# Patient Record
Sex: Male | Born: 1975 | Race: White | Hispanic: No | Marital: Married | Smoking: Former smoker
Health system: Southern US, Community
[De-identification: ages and names within clinical notes are randomized; demographics above are authoritative.]

## PROBLEM LIST (undated history)

## (undated) DIAGNOSIS — R7989 Other specified abnormal findings of blood chemistry: Secondary | ICD-10-CM

## (undated) DIAGNOSIS — G47419 Narcolepsy without cataplexy: Secondary | ICD-10-CM

## (undated) DIAGNOSIS — F419 Anxiety disorder, unspecified: Secondary | ICD-10-CM

## (undated) DIAGNOSIS — E785 Hyperlipidemia, unspecified: Secondary | ICD-10-CM

## (undated) HISTORY — PX: KNEE ARTHROSCOPY: SUR90

## (undated) HISTORY — PX: WRIST SURGERY: SHX841

## (undated) HISTORY — PX: APPENDECTOMY: SHX54

## (undated) HISTORY — PX: CERVICAL DISCECTOMY: SHX98

---

## 2002-06-26 ENCOUNTER — Encounter: Payer: Self-pay | Admitting: Sports Medicine

## 2002-06-26 ENCOUNTER — Ambulatory Visit (HOSPITAL_COMMUNITY): Admission: RE | Admit: 2002-06-26 | Discharge: 2002-06-26 | Payer: Self-pay | Admitting: Sports Medicine

## 2006-07-20 ENCOUNTER — Ambulatory Visit (HOSPITAL_COMMUNITY): Admission: RE | Admit: 2006-07-20 | Discharge: 2006-07-21 | Payer: Self-pay | Admitting: Neurological Surgery

## 2009-04-27 ENCOUNTER — Ambulatory Visit (HOSPITAL_COMMUNITY): Admission: RE | Admit: 2009-04-27 | Discharge: 2009-04-27 | Payer: Self-pay | Admitting: Neurological Surgery

## 2010-11-16 ENCOUNTER — Encounter (HOSPITAL_COMMUNITY)
Admission: RE | Admit: 2010-11-16 | Discharge: 2010-11-16 | Disposition: A | Discharge status: Home / Self Care | Payer: 59 | Source: Ambulatory Visit | Attending: Orthopedic Surgery | Admitting: Orthopedic Surgery

## 2010-11-16 LAB — SURGICAL PCR SCREEN
MRSA, PCR: NEGATIVE
Staphylococcus aureus: NEGATIVE

## 2010-11-16 LAB — COMPREHENSIVE METABOLIC PANEL
Albumin: 4.3 g/dL (ref 3.5–5.2)
BUN: 17 mg/dL (ref 6–23)
CO2: 29 mEq/L (ref 19–32)
Chloride: 103 mEq/L (ref 96–112)
Creatinine, Ser: 0.74 mg/dL (ref 0.4–1.5)
GFR calc non Af Amer: >60 mL/min (ref >60)
Glucose, Bld: 104 mg/dL — ABNORMAL HIGH (ref 70–99)
Total Bilirubin: 1.8 mg/dL — ABNORMAL HIGH (ref 0.3–1.2)

## 2010-11-16 LAB — CBC
HCT: 45.1 % (ref 39.0–52.0)
MCH: 31.7 pg (ref 26.0–34.0)
MCV: 92.2 fL (ref 78.0–100.0)
Platelets: 154 10*3/uL (ref 150–400)
RBC: 4.89 MIL/uL (ref 4.22–5.81)

## 2010-11-16 LAB — DIFFERENTIAL
Eosinophils Absolute: 0.1 10*3/uL (ref 0.0–0.7)
Lymphs Abs: 2.5 10*3/uL (ref 0.7–4.0)
Monocytes Relative: 9 % (ref 3–12)
Neutrophils Relative %: 48 % (ref 43–77)

## 2010-11-18 ENCOUNTER — Observation Stay (HOSPITAL_COMMUNITY)
Admission: RE | Admit: 2010-11-18 | Discharge: 2010-11-19 | Disposition: A | Discharge status: Home / Self Care | Payer: 59 | Source: Ambulatory Visit | Attending: Orthopedic Surgery | Admitting: Orthopedic Surgery

## 2010-11-18 ENCOUNTER — Other Ambulatory Visit: Payer: Self-pay | Admitting: Orthopedic Surgery

## 2010-11-18 DIAGNOSIS — F411 Generalized anxiety disorder: Secondary | ICD-10-CM | POA: Insufficient documentation

## 2010-11-18 DIAGNOSIS — D1600 Benign neoplasm of scapula and long bones of unspecified upper limb: Principal | ICD-10-CM | POA: Insufficient documentation

## 2010-11-18 DIAGNOSIS — Z01812 Encounter for preprocedural laboratory examination: Secondary | ICD-10-CM | POA: Insufficient documentation

## 2010-11-18 LAB — GRAM STAIN
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
## 2010-11-23 LAB — TISSUE CULTURE

## 2010-11-23 LAB — ANAEROBIC CULTURE

## 2010-12-20 LAB — FUNGUS CULTURE W SMEAR: Fungal Smear: NONE SEEN

## 2010-12-24 NOTE — Op Note (Signed)
NAMEMIACHEL, Ray Ashley               ACCOUNT NO.:  [id number]   MEDICAL RECORD NO.:  [id number]          PATIENT TYPE:  AMB   LOCATION:  SDS                          FACILITY:  [facility]   PHYSICIAN:  Stefani Dama, M.D.  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   POSTOPERATIVE DIAGNOSIS:  Herniated nucleus pulposus L5-S1 left with  left lumbar radiculopathy.   PROCEDURE:  METRx diskectomy L5-S1 left with operating microscope  microdissection technique.   SURGEON:  Dr. Danielle Dess.   FIRST ASSISTANT:  Dr. Delma Officer.   ANESTHESIA:  General endotracheal.   INDICATIONS:  Mr. Ray Ashley is a 35 year old individual who has had  significant back and left lower extremity pain.  He has a large extruded  fragment of disk at the L5-S1 level.  After careful consideration of his  options he has been advised regarding surgical decompression of his disk  herniation.   PROCEDURE:  The patient was brought to the operating room supine on the  stretcher.  After smooth induction of general endotracheal anesthesia he  was turned prone and the back was prepped with DuraPrep and draped in a  sterile fashion.  Using fluoroscopic guidance to localize the L5-S1  space on the left the skin above this area was infiltrated with 30 mL of  local 1% lidocaine with 1:1000 epinephrine mixed 50 50 with 0.5%  Marcaine.  An incision was made in the left paramedian by approximately  8 mm and a K-wire was then passed down the laminar arch of L5.  A series  of dilators and was then passed over the K-wire to use a wanding  technique to dissect the area at the L5-S1 interspace.  Ultimately an 18  mm diameter x 5 cm deep endoscopic cannula was affixed to the operating  table with a clamp.  The operating microscope was draped and brought  in  through the field to look through this aperture.  At the base was some  soft tissue which was cauterized with the monopolar cautery and then the  laminar arch of L5 was identified.  Soft tissue was cleared from over it  and on the inferior aspect a laminotomy was then created removing the  inferior marginal lamina of L5 out to the medial wall of the facet.  The  ligament was then taken up with a 2 and a 3 mm Kerrison punch until the  dura was identified.  The dura itself was noted be very reddened in  color with some dilated venous syncytium forming over it.  This was  dissected away and the underlying dura was then retracted medially.  Underneath this area there was several fragments of discs that were  immediately encountered.  They were dissected and removed and allowed  access to the disk space.  There was a substantial rent in the posterior  longitudinal ligament.  Palpating cephalad I identified several other  fragments of disc under  the common dural tube above the takeoff of the  S1 nerve root.  Laterally several pieces of small disc like material  were removed, but then the path for the L5 nerve root above could be  easily sounded out.  No other fragments of disc were returned.   The disk space being this thus wide-open it was felt that it would need  to be cleaned and a 2 and 3 mm Kerrison punch was used to lift up the  ligament, remove it and then the disk space was entered with a series of  Kerrison rongeurs to evacuate a modest amount of disc material from  within it.  After the disk was evacuated and there was felt not to be  any other free fragments of disc within the space that would herniate  out irrigation on several courses with a small pressure irrigator  yielded no other fragments.  The space was explored below the nerve  here.  On the lateral and dorsal aspect of the common dural tube there  was noted be a very large fragment of disc material.  This was quite   adherent to the dura and after some gentle mobilization a large singular  fragment of disc was removed from the space.  This allowed good  visualization and decompression of the S1 nerve root.  Further  exploration yielded some other small fragments under the common dural  tube, but in the end this space was well decompressed.  The S1 nerve  root was well decompressed.  Hemostasis in the soft tissues was obtained  with the bipolar cautery and small pledgets of Gelfoam which were  irrigated away. One mL of fentanyl was left in the epidural space.  This  was 50 mcg.  The microscope was then brought to low-power.  The  subcutaneous fascia was closed with 3-0 Vicryl and 3-0 Vicryl used close  the skin.  Dermabond was placed on the skin.  Blood loss was estimated  at less than 50 mL.  The patient tolerated the procedure well and was  returned to the recovery in stable condition.      Stefani Dama, M.D.  Electronically Signed     HJE/MEDQ  D:  07/20/2006  T:  07/21/2006  Job:  [id number]

## 2011-01-03 LAB — AFB CULTURE WITH SMEAR (NOT AT ARMC): Acid Fast Smear: NONE SEEN

## 2011-04-12 ENCOUNTER — Other Ambulatory Visit: Payer: Self-pay | Admitting: Internal Medicine

## 2011-04-12 DIAGNOSIS — R7401 Elevation of levels of liver transaminase levels: Secondary | ICD-10-CM

## 2011-04-18 ENCOUNTER — Other Ambulatory Visit: Payer: Self-pay | Admitting: Internal Medicine

## 2011-04-18 ENCOUNTER — Ambulatory Visit
Admission: RE | Admit: 2011-04-18 | Discharge: 2011-04-18 | Disposition: A | Discharge status: Home / Self Care | Payer: 59 | Source: Ambulatory Visit | Attending: Internal Medicine | Admitting: Internal Medicine

## 2011-04-18 DIAGNOSIS — R7401 Elevation of levels of liver transaminase levels: Secondary | ICD-10-CM

## 2013-02-21 ENCOUNTER — Emergency Department (HOSPITAL_COMMUNITY): Payer: 59

## 2013-02-21 ENCOUNTER — Emergency Department (HOSPITAL_COMMUNITY)
Admission: EM | Admit: 2013-02-21 | Discharge: 2013-02-21 | Disposition: A | Discharge status: Home / Self Care | Payer: 59 | Attending: Emergency Medicine | Admitting: Emergency Medicine

## 2013-02-21 ENCOUNTER — Encounter (HOSPITAL_COMMUNITY): Payer: Self-pay | Admitting: Emergency Medicine

## 2013-02-21 DIAGNOSIS — F411 Generalized anxiety disorder: Secondary | ICD-10-CM | POA: Insufficient documentation

## 2013-02-21 DIAGNOSIS — R5383 Other fatigue: Secondary | ICD-10-CM | POA: Insufficient documentation

## 2013-02-21 DIAGNOSIS — Z87891 Personal history of nicotine dependence: Secondary | ICD-10-CM | POA: Insufficient documentation

## 2013-02-21 DIAGNOSIS — J3489 Other specified disorders of nose and nasal sinuses: Secondary | ICD-10-CM | POA: Insufficient documentation

## 2013-02-21 DIAGNOSIS — I4891 Unspecified atrial fibrillation: Secondary | ICD-10-CM

## 2013-02-21 DIAGNOSIS — G4733 Obstructive sleep apnea (adult) (pediatric): Secondary | ICD-10-CM | POA: Insufficient documentation

## 2013-02-21 DIAGNOSIS — I499 Cardiac arrhythmia, unspecified: Secondary | ICD-10-CM | POA: Insufficient documentation

## 2013-02-21 DIAGNOSIS — E291 Testicular hypofunction: Secondary | ICD-10-CM | POA: Insufficient documentation

## 2013-02-21 DIAGNOSIS — R51 Headache: Secondary | ICD-10-CM | POA: Insufficient documentation

## 2013-02-21 DIAGNOSIS — Z91199 Patient's noncompliance with other medical treatment and regimen due to unspecified reason: Secondary | ICD-10-CM | POA: Insufficient documentation

## 2013-02-21 DIAGNOSIS — Z79899 Other long term (current) drug therapy: Secondary | ICD-10-CM | POA: Insufficient documentation

## 2013-02-21 DIAGNOSIS — R5381 Other malaise: Secondary | ICD-10-CM | POA: Insufficient documentation

## 2013-02-21 DIAGNOSIS — Z9119 Patient's noncompliance with other medical treatment and regimen: Secondary | ICD-10-CM | POA: Insufficient documentation

## 2013-02-21 DIAGNOSIS — R000 Tachycardia, unspecified: Secondary | ICD-10-CM | POA: Insufficient documentation

## 2013-02-21 DIAGNOSIS — E785 Hyperlipidemia, unspecified: Secondary | ICD-10-CM | POA: Insufficient documentation

## 2013-02-21 DIAGNOSIS — R61 Generalized hyperhidrosis: Secondary | ICD-10-CM | POA: Insufficient documentation

## 2013-02-21 DIAGNOSIS — G47419 Narcolepsy without cataplexy: Secondary | ICD-10-CM | POA: Insufficient documentation

## 2013-02-21 HISTORY — DX: Narcolepsy without cataplexy: G47.419

## 2013-02-21 HISTORY — DX: Hyperlipidemia, unspecified: E78.5

## 2013-02-21 HISTORY — DX: Other specified abnormal findings of blood chemistry: R79.89

## 2013-02-21 HISTORY — DX: Anxiety disorder, unspecified: F41.9

## 2013-02-21 HISTORY — DX: Gilbert syndrome: E80.4

## 2013-02-21 LAB — URINALYSIS, ROUTINE W REFLEX MICROSCOPIC
Bilirubin Urine: NEGATIVE
Leukocytes, UA: NEGATIVE
Nitrite: NEGATIVE
Specific Gravity, Urine: 1.016 (ref 1.005–1.030)
Urobilinogen, UA: 0.2 mg/dL (ref 0.0–1.0)
pH: 5.5 (ref 5.0–8.0)

## 2013-02-21 LAB — COMPREHENSIVE METABOLIC PANEL
ALT: 33 U/L (ref 0–53)
CO2: 23 mEq/L (ref 19–32)
Calcium: 9.2 mg/dL (ref 8.4–10.5)
Creatinine, Ser: 0.71 mg/dL (ref 0.50–1.35)
GFR calc Af Amer: >90 mL/min (ref >90)
GFR calc non Af Amer: >90 mL/min (ref >90)
Glucose, Bld: 119 mg/dL — ABNORMAL HIGH (ref 70–99)
Sodium: 136 mEq/L (ref 135–145)
Total Protein: 7.2 g/dL (ref 6.0–8.3)

## 2013-02-21 LAB — CBC WITH DIFFERENTIAL/PLATELET
Eosinophils Absolute: 0.2 10*3/uL (ref 0.0–0.7)
Eosinophils Relative: 2 % (ref 0–5)
HCT: 48.7 % (ref 39.0–52.0)
Lymphs Abs: 3.2 10*3/uL (ref 0.7–4.0)
MCH: 32.6 pg (ref 26.0–34.0)
MCV: 91.9 fL (ref 78.0–100.0)
Monocytes Absolute: 0.6 10*3/uL (ref 0.1–1.0)
Platelets: 160 10*3/uL (ref 150–400)
RDW: 12.4 % (ref 11.5–15.5)

## 2013-02-21 LAB — POCT I-STAT TROPONIN I: Troponin i, poc: 0.01 ng/mL (ref 0.00–0.08)

## 2013-02-21 LAB — APTT: aPTT: 29 seconds (ref 24–37)

## 2013-02-21 LAB — PROTIME-INR: INR: 0.9 (ref 0.00–1.49)

## 2013-02-21 MED ORDER — DILTIAZEM HCL 25 MG/5ML IV SOLN
25.0000 mg | Freq: Once | INTRAVENOUS | Status: AC
  Administered 2013-02-21: 25 mg via INTRAVENOUS
  Filled 2013-02-21: qty 5

## 2013-02-21 MED ORDER — FLECAINIDE ACETATE 100 MG PO TABS
300.0000 mg | ORAL_TABLET | Freq: Once | ORAL | Status: AC
  Administered 2013-02-21: 300 mg via ORAL
  Filled 2013-02-21: qty 3

## 2013-02-21 MED ORDER — SODIUM CHLORIDE 0.9 % IV SOLN
Freq: Once | INTRAVENOUS | Status: AC
  Administered 2013-02-21: 09:00:00 via INTRAVENOUS

## 2013-02-21 MED ORDER — DILTIAZEM HCL 100 MG IV SOLR
10.0000 mg/h | INTRAVENOUS | Status: DC
  Administered 2013-02-21: 10 mg/h via INTRAVENOUS

## 2013-02-21 NOTE — ED Notes (Signed)
Cardiology MD at bedside.

## 2013-02-21 NOTE — Progress Notes (Signed)
Middle aged man with new onset of rapid atrial fibrillation.  Rx with IV Cardizem for rate control --? Rate in 70s but still AF.  Seen by [facility], who gave him po flecanide, which converted him.  Released to followup with [facility] in the office.

## 2013-02-21 NOTE — Progress Notes (Addendum)
Pt spontaneously converted to sinus rhythm after flecainide given. Spoke with Dr Eden Emms. OK to d/c home today. Outpatient follow up arranged.

## 2013-02-21 NOTE — ED Provider Notes (Signed)
History    CSN: [id number] Arrival date & time 02/21/13  4098  First MD Initiated Contact with Patient 02/21/13 0845     Chief Complaint  Patient presents with  . Palpitations    HPI  Pt is a 37 yo M with pmh of Gilbert's syndrome, herniated disk s/p diskectomy, central sleep apnea on cpap at home, anxiety, and narcolepsy, on testosterone replacement who presents with palpitations less than 1 day ago. Pt reports last night at approximately 10 pm he had a flutter in chest that persisted throughout the night and in the morning. He has not had any prior episodes of palpitations. No fever, chills,dyspnea, CP, cough, pain in jaw or left arm, leg swelling, tremor, weight loss, or diaphoresis.  Pt reports he has not been able to take his narcolepsy medication, modavigil for past 2 weeks due to insurance issues and has been feeling fatigued as a result. He has been camping in the past weeks and this past weekend was not able to use his CPAP machine as he normally does.  Has been on testosterone replacement for low testosterone levels. No pmh of arrhythmias, hypertension, or pulmonary problems. No pmh of thyroid problems.        Past Medical History  Diagnosis Date  . Narcolepsy   . Anxiety    History reviewed. No pertinent past surgical history. History reviewed. No pertinent family history. History  Substance Use Topics  . Smoking status: Never Smoker   . Smokeless tobacco: Not on file  . Alcohol Use: Yes     Comment: occ    Review of Systems  Constitutional: Positive for diaphoresis (at baseline) and fatigue. Negative for fever, chills and unexpected weight change.  HENT: Positive for sneezing. Negative for trouble swallowing and voice change.   Respiratory: Negative for cough and shortness of breath.   Cardiovascular: Positive for palpitations. Negative for chest pain and leg swelling.  Gastrointestinal: Negative for nausea, vomiting, abdominal pain, diarrhea, constipation and blood  in stool.  Endocrine: Negative for heat intolerance.  Genitourinary: Negative for hematuria and difficulty urinating.  Neurological: Positive for headaches. Negative for dizziness and tremors.  Psychiatric/Behavioral: Negative for agitation. The patient is not nervous/anxious.     Allergies  Review of patient's allergies indicates no known allergies.  Home Medications   Current Outpatient Rx  Name  Route  Sig  Dispense  Refill  . B Complex Vitamins (VITAMIN B COMPLEX) TABS   Oral   Take 1 tablet by mouth daily.         . fish oil-omega-3 fatty acids 1000 MG capsule   Oral   Take 1 g by mouth daily.         . meloxicam (MOBIC) 7.5 MG tablet   Oral   Take 7.5 mg by mouth daily.         . methocarbamol (ROBAXIN) 500 MG tablet   Oral   Take 500 mg by mouth 4 (four) times daily as needed.         . modafinil (PROVIGIL) 200 MG tablet   Oral   Take 200 mg by mouth daily.         Marland Kitchen testosterone cypionate (DEPOTESTOTERONE CYPIONATE) 200 MG/ML injection   Intramuscular   Inject 300 mg into the muscle every 14 (fourteen) days.         Marland Kitchen venlafaxine XR (EFFEXOR-XR) 150 MG 24 hr capsule   Oral   Take 150 mg by mouth daily.  BP 105/48  Pulse 75  Temp(Src) 97.8 F (36.6 C) (Oral)  Resp 13  SpO2 98% Physical Exam  Constitutional: He is oriented to person, place, and time. He appears well-developed and well-nourished. No distress.  HENT:  Head: Normocephalic and atraumatic.  Eyes: EOM are normal.  Neck: Normal range of motion. Neck supple.  Cardiovascular: Intact distal pulses.   No murmur heard. tachycardic Irregular rhythm   Pulmonary/Chest: Effort normal and breath sounds normal. No respiratory distress.  Abdominal: Soft. Bowel sounds are normal. He exhibits no distension.  Musculoskeletal: Normal range of motion. He exhibits no edema.  Neurological: He is alert and oriented to person, place, and time.  Skin: Skin is warm and dry. He is not  diaphoretic.  Psychiatric: He has a normal mood and affect.    ED Course  Procedures (including critical care time) Labs Reviewed  CBC WITH DIFFERENTIAL - Abnormal; Notable for the following:    Hemoglobin 17.3 (*)    All other components within normal limits  COMPREHENSIVE METABOLIC PANEL - Abnormal; Notable for the following:    Glucose, Bld 119 (*)    Alkaline Phosphatase 38 (*)    All other components within normal limits  URINALYSIS, ROUTINE W REFLEX MICROSCOPIC  TSH  PROTIME-INR  APTT  T4, FREE  POCT I-STAT TROPONIN I   Dg Chest Port 1 View  02/21/2013   *RADIOLOGY REPORT*  Clinical Data: New onset atrial fibrillation  PORTABLE CHEST - 1 VIEW  Comparison: 07/20/2016  Findings: The heart size and mediastinal contours are within normal limits.  Both lungs are clear.  The visualized skeletal structures are unremarkable.  IMPRESSION: Negative exam.   Original Report Authenticated By: Signa Kell, M.D.   1. Atrial fibrillation with RVR     MDM  Assessment: 37 yo M with pmh of herniated disk s/p diskectomy, central OSA, narcolepsy, and anxiety who presents with palpitations less than 1 day ago.     Plan:   Palpitations - Arrythmia vs ACS vs hyperthyroidism vs Central sleep apena vs CHF vs SE medication -Obtain 12-lead EKG ----> Atrial fibrillation with ventricular response and incomplete right bundle branch block, no prior EKG for comparision     -Obtain troponin levels ---> WNL -Obtain CXR ---> No evidence of cardiopulmonary disease  -Obtain CBC w/ diff ---> elevated hemoglobin (17.3) -Obtain CMP ---> hyperglycemia (119) -Obtain UA --->  Normal  -Obtain PT/PTT ---> WNL -Obtain TSH, free T4 ---> WNL  -Administer bolus and continuous diltiazem for rate control  ---> returned to normal rate -Administer NS IV fluids  -Monitor on cardiac monitor  -Consult to cardiology ---> given PO 300mg  flecainide with spontaneous coversion back to normal sinus rhythm         Repeat EKG  after Flecainide:   Date: 02/21/2013  Rate: 70  Rhythm: normal sinus rhythm  QRS Axis: normal  Intervals: normal  ST/T Wave abnormalities: normal  Conduction Disutrbances:none  Narrative Interpretation:   Old EKG Reviewed: changes noted    Disposition: Home--> Pt with new-onset atrial fibrillation with rapid ventricular response with return to normal rate with diltiazem and normal sinus rhythm after flecainide. Etiology of new-onset atrial fibrillation unknown. No evidence of cardiopulmonary disease on CXR and normal thyroid function. Possibly due to central sleep apnea (has not been using CPAP on regular basis) or medication induced (last 2 weeks not on modafinil for narcolepsy) . Will need to follow-up with cardiology for further work-up of cardiac function.  Discharge Instructions:  -Continue taking home medications  -To follow-up with Dr. Eden Emms on 7/21                 Otis Brace, MD 02/21/13 1614

## 2013-02-21 NOTE — Consult Note (Signed)
CARDIOLOGY CONSULT NOTE   Patient ID: Ray Ashley MRN: [id number] DOB/AGE: 16-Jul-1976 36 y.o.  Admit date: 02/21/2013  Primary Physician   Gwen Pounds, MD Primary Cardiologist   New/Shilynn Hoch Reason for Consultation  New onset Ray-fib  OZH:YQMVH Ray Ashley is Ray 37 y.o. male with no cardiac history and positive h/o Gilbert's disease and hyperlipidemia who presents c/o of palpitations since yesterday evening.   Last night at 10:00pm as he was drinking Ray slushy he began feeling "fluttering in his chest". Thought it was indigestion or due to him drinking cold slushy so he went to bed. Woke up at 11:30pm and was unable to fall asleep again until 1:00am. This is unusual for him as he normally sleeps through the night. He is unsure if palpitations are what woke him up, but the "fluttering in his chest" was present during this time. Upon waking this morning, palpitations were still present. Due to concern that the palpitations were not going away, he and his wife presented to the ED. Denies chest pain or tightness, SOB, dizziness, HA, fever, peripheral edema, dry skin, abdominal pain, or diarrhea. Admits that he has not taken Provigil for past 2 weeks dt insurance change and difficulty obtaining this medication. During this time, caffeine has increased from 3 to 5 servings per day of mostly coffee. Otherwise no change in medications. Child recently had scarlet fever and now has second episode of strep throat. Patient denies recent illness and has not experienced sore throat or dysphagia. Wife notes BP normally 140s systolic and is concerned as BP in ED was 105/48.   Cardizem 25mg  IV bolus administered and Cardizem drip 15mL/hr initiated at 9:00am. Cardizem drip titrated up to 49mL/hr at 11:30am.    Past Medical History  Diagnosis Date  . Narcolepsy   . Anxiety   . Gilbert's disease     2013  . Hyperlipidemia   . Low testosterone      Past Surgical History  Procedure Laterality Date  .  Cervical discectomy      2009  . Wrist surgery      2013  . Knee arthroscopy      1999 and 2004  . Appendectomy      childhood    No Known Allergies  I have reviewed the patient's current medications . flecainide  300 mg Oral Once   . diltiazem (CARDIZEM) infusion 15 mg/hr (02/21/13 1127)     Prior to Admission medications   Medication Sig Start Date End Date Taking? Authorizing Provider  B Complex Vitamins (VITAMIN B COMPLEX) TABS Take 1 tablet by mouth daily.   Yes Historical Provider, MD  fish oil-omega-3 fatty acids 1000 MG capsule Take 1 g by mouth daily.   Yes Historical Provider, MD  meloxicam (MOBIC) 7.5 MG tablet Take 7.5 mg by mouth daily.   Yes Historical Provider, MD  methocarbamol (ROBAXIN) 500 MG tablet Take 500 mg by mouth 4 (four) times daily as needed.   Yes Historical Provider, MD  modafinil (PROVIGIL) 200 MG tablet Take 200 mg by mouth daily.   Yes Historical Provider, MD  testosterone cypionate (DEPOTESTOTERONE CYPIONATE) 200 MG/ML injection Inject 300 mg into the muscle every 14 (fourteen) days.   Yes Historical Provider, MD  venlafaxine XR (EFFEXOR-XR) 150 MG 24 hr capsule Take 150 mg by mouth daily.   Yes Historical Provider, MD     History   Social History  . Marital Status: Married    Spouse Name: N/Ray  Number of Children: 2  . Years of Education: N/Ray   Occupational History  . Geophysical data processor    Social History Main Topics  . Smoking status: Former Smoker    Types: Cigarettes    Quit date: 02/22/2003  . Smokeless tobacco: Not on file  . Alcohol Use: Yes     Comment: 1 drink per week socially  . Drug Use: No  . Sexually Active: Not on file   Social History Narrative   Patient married with children. Works in Manufacturing engineer. Drinks 3 servings caffeine daily.     Family History  Problem Relation Age of Onset  . Heart attack Maternal Grandfather     deceased from AMI age 90s     ROS:  Full 14 point review of systems  complete and found to be negative unless listed above.  Physical Exam: Blood pressure 135/77, pulse 96, temperature 97.8 F (36.6 C), temperature source Oral, resp. rate 16, SpO2 97.00%.  General: Well developed, well nourished, male in no acute distress Head: Normocephalic and atraumatic. Lungs: No increase in respiratory effort. CTAB.  Heart: Irregularly irregular with rate in 90s. Audible S1 S2, no rub/gallop or murmur. Radials 2+ and equal bilaterally.   Neck: No carotid bruits or JVD. Extremities: No clubbing or cyanosis. No edema.  Neuro: Alert and oriented X 3. No focal deficits noted. Psych:  Good affect, responds appropriately  Labs:   Lab Results  Component Value Date   WBC 7.2 02/21/2013   HGB 17.3* 02/21/2013   HCT 48.7 02/21/2013   MCV 91.9 02/21/2013   PLT 160 02/21/2013    Recent Labs  02/21/13 0830  INR 0.90    POC troponin i: 0.01   Recent Labs Lab 02/21/13 0830  NA 136  K 4.4  CL 101  CO2 23  BUN 16  CREATININE 0.71  CALCIUM 9.2  PROT 7.2  BILITOT 0.7  ALKPHOS 38*  ALT 33  AST 28  GLUCOSE 119*    ECG:  Atrial fibrillation with RVR; 133 bpm.   Tele: Rate controlled Ray-fib; rate in 90s.   Radiology:  Dg Chest Port 1 View  02/21/2013   *RADIOLOGY REPORT*  Clinical Data: New onset atrial fibrillation  PORTABLE CHEST - 1 VIEW  Comparison: 07/20/2016  Findings: The heart size and mediastinal contours are within normal limits.  Both lungs are clear.  The visualized skeletal structures are unremarkable.  IMPRESSION: Negative exam.   Original Report Authenticated By: Signa Kell, M.D.    ASSESSMENT AND PLAN:     1. Ray-fib: 37 y/o M with no cardiac history who began experiencing "fluttering in chest" last evening which has persisted. Continue Cardizem drip 74mL/hr for rate control. Consider electrical cardioversion or single high-dose flecainide for return to NSR. If unable to obtain rhythm control, will admit to hospital and perform echocardiogram. If  NSR achieved, patient will f/u in office and have outpatient echocardiogram.   Signed: Theodore Demark, PA-C 02/21/2013 12:53 PM Beeper [phone]  Co-Sign MD  Patient examined chart reviewed Plan of care discussed with wife and patient.  Try flecainide 300mg .  If he does not convert will plan [facility] at 5:00 today.  Risk of stroke and proarhythmia  Discussed Willing to proceed.  Will need echo at some point.  No excessive ETOH or caffiene no family history of afib.  Normal exam.    Charlton Haws

## 2013-02-21 NOTE — ED Provider Notes (Signed)
I saw and evaluated the patient, reviewed the resident's note and I agree with the findings and plan. Middle aged man with new onset of rapid atrial fibrillation.  Rx with IV Cardizem for rate control --? Rate in 70s but still AF.  Seen by [facility], who gave him po flecanide, which converted him.  Released to followup with [facility] in the office.   Carleene Cooper III, MD 02/21/13 [phone]

## 2013-02-21 NOTE — ED Notes (Signed)
Pt sts started having palpitations last night and worse this am; pt noted to be in rapid irregular rhythm; no hx of same

## 2013-02-21 NOTE — ED Notes (Addendum)
Pharmacy and Bjorn Loser, Cardiology PA called and verified that Flecainide and Cardizem are to be given at same time.

## 2013-02-21 NOTE — ED Notes (Signed)
MD at bedside. 

## 2013-02-22 ENCOUNTER — Other Ambulatory Visit (HOSPITAL_COMMUNITY): Payer: Self-pay | Admitting: Cardiovascular Disease

## 2013-02-22 DIAGNOSIS — I4891 Unspecified atrial fibrillation: Secondary | ICD-10-CM

## 2013-02-25 ENCOUNTER — Encounter: Payer: Self-pay | Admitting: Cardiovascular Disease

## 2013-02-25 ENCOUNTER — Ambulatory Visit (INDEPENDENT_AMBULATORY_CARE_PROVIDER_SITE_OTHER): Payer: 59 | Admitting: Cardiovascular Disease

## 2013-02-25 ENCOUNTER — Other Ambulatory Visit: Payer: Self-pay | Admitting: *Deleted

## 2013-02-25 ENCOUNTER — Ambulatory Visit (HOSPITAL_COMMUNITY): Payer: 59 | Attending: Cardiology

## 2013-02-25 VITALS — BP 124/75 | HR 88 | Resp 12

## 2013-02-25 DIAGNOSIS — E785 Hyperlipidemia, unspecified: Secondary | ICD-10-CM | POA: Insufficient documentation

## 2013-02-25 DIAGNOSIS — Z87891 Personal history of nicotine dependence: Secondary | ICD-10-CM | POA: Insufficient documentation

## 2013-02-25 DIAGNOSIS — R7989 Other specified abnormal findings of blood chemistry: Secondary | ICD-10-CM | POA: Insufficient documentation

## 2013-02-25 DIAGNOSIS — I4891 Unspecified atrial fibrillation: Secondary | ICD-10-CM

## 2013-02-25 DIAGNOSIS — G47419 Narcolepsy without cataplexy: Secondary | ICD-10-CM

## 2013-02-25 DIAGNOSIS — F419 Anxiety disorder, unspecified: Secondary | ICD-10-CM | POA: Insufficient documentation

## 2013-02-25 DIAGNOSIS — I480 Paroxysmal atrial fibrillation: Secondary | ICD-10-CM | POA: Insufficient documentation

## 2013-02-25 MED ORDER — FLECAINIDE ACETATE 100 MG PO TABS: ORAL_TABLET | ORAL | Status: DC

## 2013-02-25 NOTE — Patient Instructions (Addendum)
Your physician wants you to follow-up in:  6 MONTHS WITH DR NISHAN  You will receive a reminder letter in the mail two months in advance. If you don't receive a letter, please call our office to schedule the follow-up appointment. Your physician recommends that you continue on your current medications as directed. Please refer to the Current Medication list given to you today. 

## 2013-02-25 NOTE — Progress Notes (Signed)
Echocardiogram performed.  

## 2013-02-25 NOTE — Assessment & Plan Note (Signed)
Lone afib pill in pocket flecainide called in Echo normal F/U in 6 months

## 2013-02-25 NOTE — Assessment & Plan Note (Signed)
Ok to resume provigil.

## 2013-02-25 NOTE — Progress Notes (Signed)
Patient ID: Ray Ashley, male   DOB: 08/31/1975, 37 y.o.   MRN: [id number] 70 yo friend seen in ER last week for PAF.  Palpitations with acute onset. Converted in ER with 300mg  flecainide. No chest pain. Has GERD with some esophageal spasm.  Better since not drinking so many mountain dews Back on Provigil which he needs for alertness during day.  Echo today reviewed.  Structurally normal heart with no atrial enlargement EF 60% Did have chordal SAM but no LVOT gradient.  No recurrent palpitations since d/c.  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  No need for ETT at this point but if he has recurrence and uses flecainide again at home would do.  In ER conversion within 2 hours with no proarrhythmia  ROS: Denies fever, malais, weight loss, blurry vision, decreased visual acuity, cough, sputum, SOB, hemoptysis, pleuritic pain, palpitaitons, heartburn, abdominal pain, melena, lower extremity edema, claudication, or rash.  All other systems reviewed and negative  General: Affect appropriate Healthy:  appears stated age HEENT: normal Neck supple with no adenopathy JVP normal no bruits no thyromegaly Lungs clear with no wheezing and good diaphragmatic motion Heart:  S1/S2 no murmur, no rub, gallop or click PMI normal Abdomen: benighn, BS positve, no tenderness, no AAA no bruit.  No HSM or HJR Distal pulses intact with no bruits No edema Neuro non-focal Skin warm and dry No muscular weakness   Current Outpatient Prescriptions  Medication Sig Dispense Refill  . B Complex Vitamins (VITAMIN B COMPLEX) TABS Take 1 tablet by mouth daily.      . fish oil-omega-3 fatty acids 1000 MG capsule Take 1 g by mouth daily.      . meloxicam (MOBIC) 7.5 MG tablet Take 7.5 mg by mouth daily.      . methocarbamol (ROBAXIN) 500 MG tablet Take 500 mg by mouth 4 (four) times daily as needed.      . modafinil (PROVIGIL) 200 MG tablet Take 200 mg by mouth daily.      Marland Kitchen testosterone cypionate  (DEPOTESTOTERONE CYPIONATE) 200 MG/ML injection Inject 300 mg into the muscle every 14 (fourteen) days.      Marland Kitchen venlafaxine XR (EFFEXOR-XR) 150 MG 24 hr capsule Take 150 mg by mouth daily.       No current facility-administered medications for this visit.    Allergies  Review of patient's allergies indicates no known allergies.  Electrocardiogram: 02/22/13 afib rate 108 otherwise normal  Assessment and Plan

## 2013-02-25 NOTE — Assessment & Plan Note (Signed)
Cholesterol is at goal.  Continue current dose of statin and diet Rx.  No myalgias or side effects.  F/U  LFT's in 6 months. No results found for this basename: LDLCALC  Labs with primary            

## 2013-10-19 IMAGING — CR DG CHEST 1V PORT
1 series · 1 of 1 positions shown · non-contrast
Comparison: 07/20/2016

CLINICAL DATA: New onset atrial fibrillation

PORTABLE CHEST - 1 VIEW

[AP]
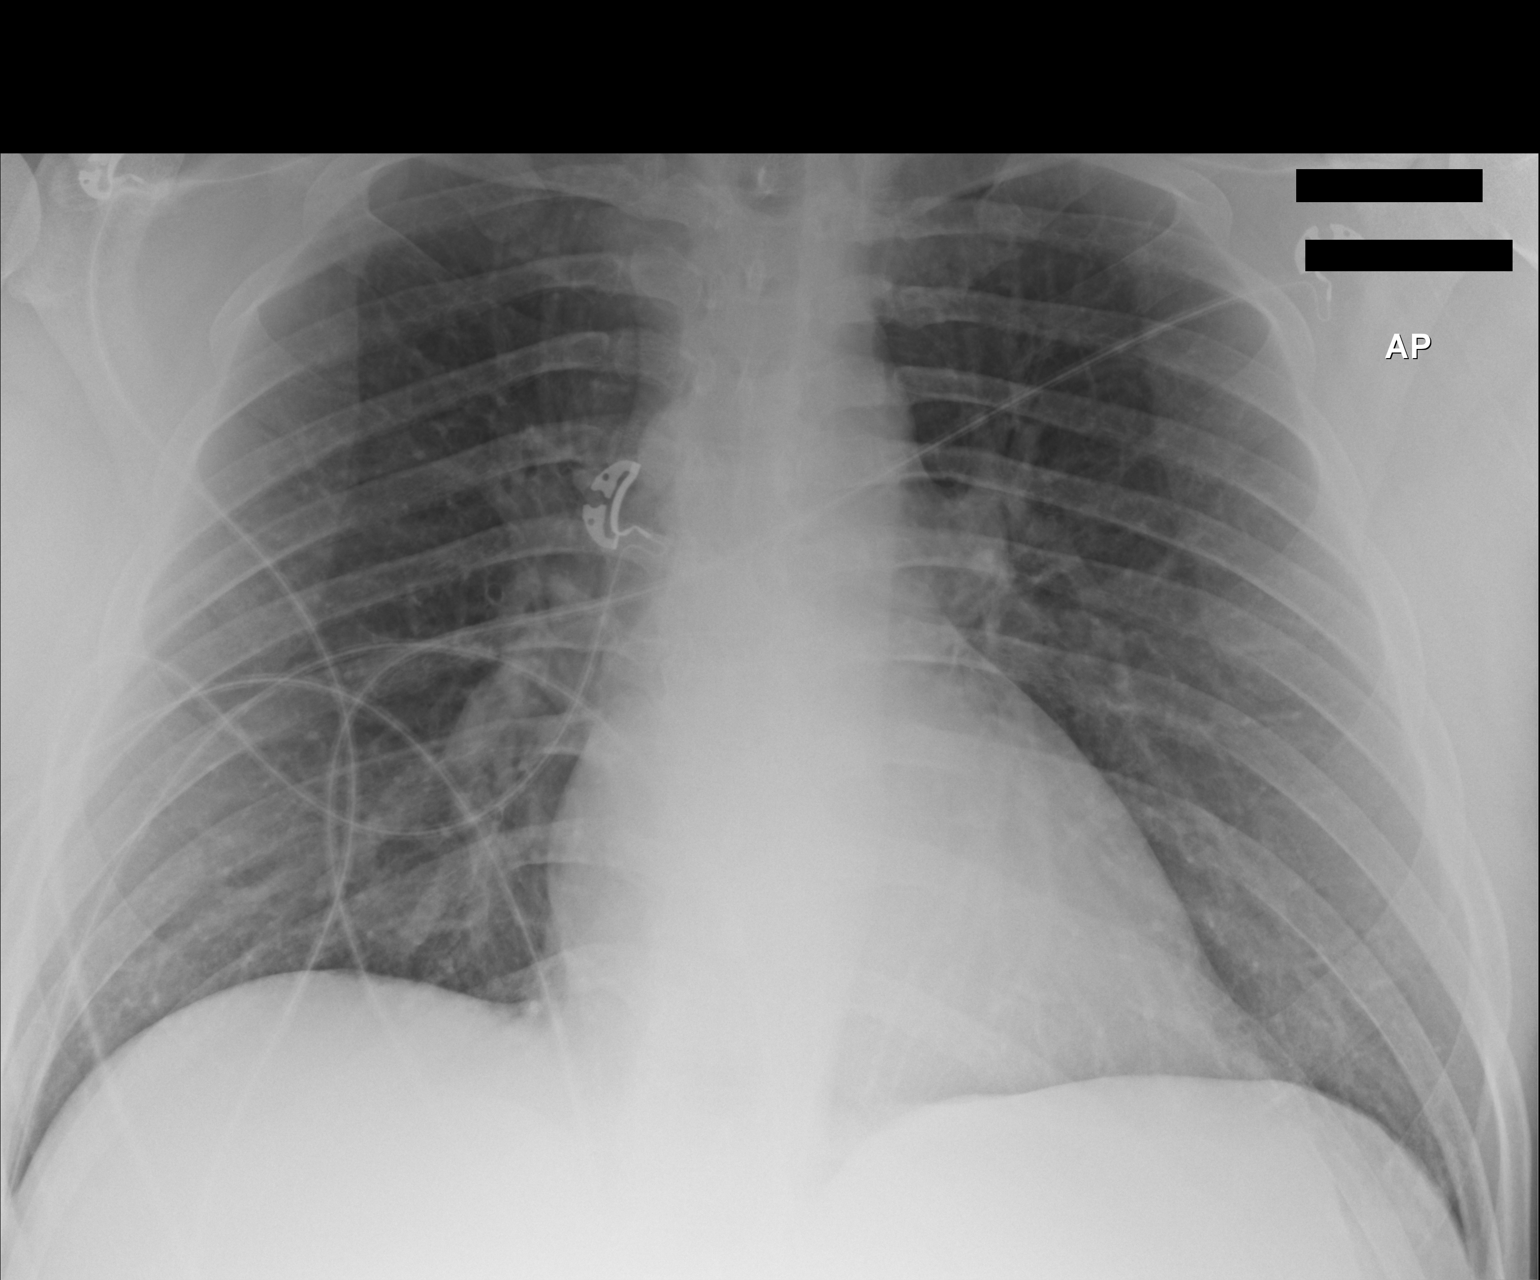

[1 of 1 positions shown; findings below may reference images not displayed]

FINDINGS: The heart size and mediastinal contours are within normal
limits.  Both lungs are clear.  The visualized skeletal structures
are unremarkable.
IMPRESSION: Negative exam.

## 2015-12-03 DIAGNOSIS — E784 Other hyperlipidemia: Secondary | ICD-10-CM | POA: Diagnosis not present

## 2015-12-03 DIAGNOSIS — Z0000 Encounter for general adult medical examination without abnormal findings: Secondary | ICD-10-CM | POA: Diagnosis not present

## 2015-12-03 DIAGNOSIS — Z125 Encounter for screening for malignant neoplasm of prostate: Secondary | ICD-10-CM | POA: Diagnosis not present

## 2015-12-03 DIAGNOSIS — R739 Hyperglycemia, unspecified: Secondary | ICD-10-CM | POA: Diagnosis not present

## 2015-12-03 DIAGNOSIS — E291 Testicular hypofunction: Secondary | ICD-10-CM | POA: Diagnosis not present

## 2015-12-14 DIAGNOSIS — R739 Hyperglycemia, unspecified: Secondary | ICD-10-CM | POA: Diagnosis not present

## 2015-12-14 DIAGNOSIS — B379 Candidiasis, unspecified: Secondary | ICD-10-CM | POA: Diagnosis not present

## 2015-12-14 DIAGNOSIS — E784 Other hyperlipidemia: Secondary | ICD-10-CM | POA: Diagnosis not present

## 2015-12-14 DIAGNOSIS — Z6832 Body mass index (BMI) 32.0-32.9, adult: Secondary | ICD-10-CM | POA: Diagnosis not present

## 2015-12-14 DIAGNOSIS — Z1389 Encounter for screening for other disorder: Secondary | ICD-10-CM | POA: Diagnosis not present

## 2015-12-14 DIAGNOSIS — I480 Paroxysmal atrial fibrillation: Secondary | ICD-10-CM | POA: Diagnosis not present

## 2015-12-14 DIAGNOSIS — Z0000 Encounter for general adult medical examination without abnormal findings: Secondary | ICD-10-CM | POA: Diagnosis not present

## 2015-12-18 ENCOUNTER — Ambulatory Visit (INDEPENDENT_AMBULATORY_CARE_PROVIDER_SITE_OTHER): Payer: BLUE CROSS/BLUE SHIELD | Admitting: Cardiovascular Disease

## 2015-12-18 ENCOUNTER — Encounter: Payer: Self-pay | Admitting: Cardiovascular Disease

## 2015-12-18 VITALS — BP 130/100 | HR 67 | Ht 73.5 in | Wt 249.6 lb

## 2015-12-18 DIAGNOSIS — I4891 Unspecified atrial fibrillation: Secondary | ICD-10-CM | POA: Diagnosis not present

## 2015-12-18 DIAGNOSIS — I480 Paroxysmal atrial fibrillation: Secondary | ICD-10-CM

## 2015-12-18 MED ORDER — NEBIVOLOL HCL 10 MG PO TABS: 10.0000 mg | ORAL_TABLET | Freq: Every day | ORAL | Status: DC

## 2015-12-18 NOTE — Progress Notes (Signed)
Patient ID: Ray Ashley, male   DOB: [birth year], 40 y.o.   MRN: [id number]     Cardiology Office Note   Date:  12/18/2015   ID:  Ashby Dawes, DOB 1976/03/22, MRN [id number]  PCP:  Precious Reel, MD  Cardiologist:   Jenkins Rouge, MD   Chief Complaint  Patient presents with  . Atrial Fibrillation    in and out of Afib recently, per pt      History of Present Illness: Ray Ashley is a 40 y.o. male who presents for evaluation of PAF.  Last seen over 3 years ago  2014 seen in ER with rapid afib. Converted with 300 mg flecainide  Echo reviewed and normal  Study Conclusions  - Left ventricle: The cavity size was normal. Wall thickness was increased in a pattern of mild LVH. Systolic function was normal. The estimated ejection fraction was in the range of 55% to 60%. Wall motion was normal; there were no regional wall motion abnormalities. Doppler parameters are consistent with abnormal left ventricular relaxation (grade 1 diastolic dysfunction). - Mitral valve: There was systolic anterior motion of the chordal structures. - Pulmonary arteries: PA peak pressure: 68mm Hg (S).  Takes provigil for narcolepsy.   Takes Effexor for anxiety attacks Has been having more frequent palpitations  1-2/xweek  Has had to take 2 flecainide  Pills on a few occasions when rapid beats last over 20 minutes.    This patients CHA2DS2-VASc Score and unadjusted Ischemic Stroke Rate (% per year) is equal to 0.2 % stroke rate/year from a score of 0  Above score calculated as 1 point each if present [CHF, HTN, DM, Vascular=MI/PAD/Aortic Plaque, Age if 65-74, or Male] Above score calculated as 2 points each if present [Age > 75, or Stroke/TIA/TE]   Past Medical History  Diagnosis Date  . Narcolepsy   . Anxiety   . Gilbert's disease     2013  . Hyperlipidemia   . Low testosterone     Past Surgical History  Procedure Laterality Date  . Cervical discectomy      2009  . Wrist  surgery      2013  . Knee arthroscopy      1999 and 2004  . Appendectomy      childhood     Current Outpatient Prescriptions  Medication Sig Dispense Refill  . flecainide (TAMBOCOR) 100 MG tablet Take 300 mg by mouth as directed.    . meloxicam (MOBIC) 7.5 MG tablet Take 7.5 mg by mouth daily.    . methocarbamol (ROBAXIN) 500 MG tablet Take 500 mg by mouth 4 (four) times daily as needed.    . modafinil (PROVIGIL) 200 MG tablet Take 200 mg by mouth daily.    Marland Kitchen testosterone cypionate (DEPOTESTOTERONE CYPIONATE) 200 MG/ML injection Inject 300 mg into the muscle every 14 (fourteen) days.    Marland Kitchen venlafaxine XR (EFFEXOR-XR) 150 MG 24 hr capsule Take 150 mg by mouth daily.     No current facility-administered medications for this visit.    Allergies:   Review of patient's allergies indicates no known allergies.    Social History:  The patient  reports that he quit smoking about 12 years ago. His smoking use included Cigarettes. He does not have any smokeless tobacco history on file. He reports that he drinks alcohol. He reports that he does not use illicit drugs.   Family History:  The patient's family history includes Heart attack in his maternal grandfather.  ROS:  Please see the history of present illness.   Otherwise, review of systems are positive for none.   All other systems are reviewed and negative.    PHYSICAL EXAM: VS:  BP 130/100 mmHg  Pulse 67  Ht 6' 1.5" (1.867 m)  Wt 113.218 kg (249 lb 9.6 oz)  BMI 32.48 kg/m2  SpO2 97% , BMI Body mass index is 32.48 kg/(m^2). Affect appropriate Healthy:  appears stated age 71: normal Neck supple with no adenopathy JVP normal no bruits no thyromegaly Lungs clear with no wheezing and good diaphragmatic motion Heart:  S1/S2 no murmur, no rub, gallop or click PMI normal Abdomen: benighn, BS positve, no tenderness, no AAA no bruit.  No HSM or HJR Distal pulses intact with no bruits No edema Neuro non-focal Skin warm and  dry No muscular weakness    EKG:  02/22/13 rapid afib rate 163 no pre excitation   12/18/15  SR rate 86 QRS 106 QT 368  Normal    Recent Labs: No results found for requested labs within last 365 days.    Lipid Panel No results found for: CHOL, TRIG, HDL, CHOLHDL, VLDL, LDLCALC, LDLDIRECT    Wt Readings from Last 3 Encounters:  12/18/15 113.218 kg (249 lb 9.6 oz)      Other studies Reviewed: Additional studies/ records that were reviewed today include: Epic notes ECG Echo 2014 .    ASSESSMENT AND PLAN:  1.  PAF Event monitor start bystolic 10 mg to counteract adrenergic effects of effexor and provigil and decrease rate of PAF If monitor shows PAF will start flecainide 75 bid and f/u with ETT in 10 days 2. Narcolepsy continue provigil 3. Anxiety Attacks continue Effexor    Current medicines are reviewed at length with the patient today.  The patient does not have concerns regarding medicines.  The following changes have been made:  Bystolic 10 mg  Labs/ tests ordered today include: 21 day event monitor   No orders of the defined types were placed in this encounter.     Disposition:   FU with me in 6 weeks      Signed, Jenkins Rouge, MD  12/18/2015 4:25 PM    [facility] [address], [place],   [address] Phone: [phone]; Fax: [phone]

## 2015-12-18 NOTE — Patient Instructions (Signed)
Medication Instructions:  Start taking Bystolic 10 mg daily-all other medications remain the same  Labwork: None  Testing/Procedures: Your physician has recommended that you wear a 21 day event monitor. Event monitors are medical devices that record the heart's electrical activity. Doctors most often [place] these monitors to diagnose arrhythmias. Arrhythmias are problems with the speed or rhythm of the heartbeat. The monitor is a small, portable device. You can wear one while you do your normal daily activities. This is usually used to diagnose what is causing palpitations/syncope (passing out).    Follow-Up: Your physician recommends that you schedule a follow-up appointment in: 6 to 7 weeks     If you need a refill on your cardiac medications before your next appointment, please call your pharmacy.

## 2015-12-22 ENCOUNTER — Ambulatory Visit (INDEPENDENT_AMBULATORY_CARE_PROVIDER_SITE_OTHER): Payer: BLUE CROSS/BLUE SHIELD

## 2015-12-22 ENCOUNTER — Ambulatory Visit: Payer: Self-pay | Admitting: Physician Assistant

## 2015-12-22 DIAGNOSIS — I480 Paroxysmal atrial fibrillation: Secondary | ICD-10-CM | POA: Diagnosis not present

## 2015-12-22 NOTE — Addendum Note (Signed)
Addended by: Freada Bergeron on: 12/22/2015 05:15 PM   Modules accepted: Orders

## 2015-12-28 ENCOUNTER — Telehealth: Payer: Self-pay

## 2015-12-28 ENCOUNTER — Other Ambulatory Visit: Payer: Self-pay | Admitting: Cardiovascular Disease

## 2015-12-28 DIAGNOSIS — N5201 Erectile dysfunction due to arterial insufficiency: Secondary | ICD-10-CM

## 2015-12-28 MED ORDER — SILDENAFIL CITRATE 50 MG PO TABS
50.0000 mg | ORAL_TABLET | Freq: Every day | ORAL | Status: DC | PRN
Start: 2015-12-28 — End: 2017-01-07

## 2015-12-28 MED ORDER — SILDENAFIL CITRATE 50 MG PO TABS: 50.0000 mg | ORAL_TABLET | Freq: Every day | ORAL | Status: DC | PRN

## 2015-12-28 NOTE — Telephone Encounter (Signed)
Left message for patient to call back. Patient has a prescription left at front desk to pick up.

## 2015-12-29 NOTE — Telephone Encounter (Signed)
PT CALLED -MADE PT AWARE HE HAS RX AT FRONT DESK

## 2016-01-19 ENCOUNTER — Encounter: Payer: Self-pay | Admitting: Cardiovascular Disease

## 2016-01-25 ENCOUNTER — Telehealth: Payer: Self-pay | Admitting: Nurse Practitioner

## 2016-01-25 MED ORDER — METOPROLOL SUCCINATE ER 25 MG PO TB24
25.0000 mg | ORAL_TABLET | Freq: Every day | ORAL | Status: DC
Start: 2016-01-25 — End: 2017-02-09

## 2016-01-25 NOTE — Telephone Encounter (Signed)
Per Dr. Johnsie Cancel:  Call in toprol 25 mg daily for him instead of bystolic   Rx for toprol 25 mg sent to patient's pharmacy

## 2016-01-26 NOTE — Progress Notes (Signed)
Patient ID: Ray Ashley, male   DOB: 09/12/1975, 40 y.o.   MRN: [id number]     Cardiology Office Note   Date:  01/28/2016   ID:  Ray Ashley, DOB 1976/07/14, MRN [id number]  PCP:  Precious Reel, MD  Cardiologist:   Jenkins Rouge, MD   Chief Complaint  Patient presents with  . Atrial Fibrillation    no sx      History of Present Illness: Ray Ashley is a 40 y.o. male who presents for evaluation of PAF.  Last seen over 3 years ago  2014 seen in ER with rapid afib. Converted with 300 mg flecainide  Echo reviewed and normal  Study Conclusions  - Left ventricle: The cavity size was normal. Wall thickness was increased in a pattern of mild LVH. Systolic function was normal. The estimated ejection fraction was in the range of 55% to 60%. Wall motion was normal; there were no regional wall motion abnormalities. Doppler parameters are consistent with abnormal left ventricular relaxation (grade 1 diastolic dysfunction). - Mitral valve: There was systolic anterior motion of the chordal structures. - Pulmonary arteries: PA peak pressure: 26mm Hg (S).  Takes provigil for narcolepsy.   Takes Effexor for anxiety attacks Has been having more frequent palpitations  1-2/xweek  Has had to take 2 flecainide  Pills on a few occasions when rapid beats last over 20 minutes.    This patients CHA2DS2-VASc Score and unadjusted Ischemic Stroke Rate (% per year) is equal to 0.2 % stroke rate/year from a score of 0  Above score calculated as 1 point each if present [CHF, HTN, DM, Vascular=MI/PAD/Aortic Plaque, Age if 65-74, or Male] Above score calculated as 2 points each if present [Age > 75, or Stroke/TIA/TE]  Started on bystolic but copay too high and changed to Toprol on 01/25/16  Event monitor: reviewed from mid May and had at least 3 episodes of PAF/Flutter.    Past Medical History  Diagnosis Date  . Narcolepsy   . Anxiety   . Gilbert's disease     2013  .  Hyperlipidemia   . Low testosterone     Past Surgical History  Procedure Laterality Date  . Cervical discectomy      2009  . Wrist surgery      2013  . Knee arthroscopy      1999 and 2004  . Appendectomy      childhood     Current Outpatient Prescriptions  Medication Sig Dispense Refill  . flecainide (TAMBOCOR) 100 MG tablet Take 300 mg by mouth as directed.    . meloxicam (MOBIC) 7.5 MG tablet Take 7.5 mg by mouth daily.    . methocarbamol (ROBAXIN) 500 MG tablet Take 500 mg by mouth 4 (four) times daily as needed.    . metoprolol succinate (TOPROL-XL) 25 MG 24 hr tablet Take 1 tablet (25 mg total) by mouth daily. 30 tablet 11  . modafinil (PROVIGIL) 200 MG tablet Take 200 mg by mouth daily.    . sildenafil (VIAGRA) 50 MG tablet Take 1 tablet (50 mg total) by mouth daily as needed for erectile dysfunction. 20 tablet 3  . testosterone cypionate (DEPOTESTOTERONE CYPIONATE) 200 MG/ML injection Inject 300 mg into the muscle every 14 (fourteen) days.    Marland Kitchen venlafaxine XR (EFFEXOR-XR) 150 MG 24 hr capsule Take 150 mg by mouth daily.     No current facility-administered medications for this visit.    Allergies:   Review of patient's allergies indicates  no known allergies.    Social History:  The patient  reports that he quit smoking about 12 years ago. His smoking use included Cigarettes. He does not have any smokeless tobacco history on file. He reports that he drinks alcohol. He reports that he does not use illicit drugs.   Family History:  The patient's family history includes Heart attack in his maternal grandfather.    ROS:  Please see the history of present illness.   Otherwise, review of systems are positive for none.   All other systems are reviewed and negative.    PHYSICAL EXAM: VS:  BP 130/90 mmHg  Pulse 72  Ht 6\' 1"  (1.854 m)  Wt 256 lb (116.121 kg)  BMI 33.78 kg/m2  SpO2 96% , BMI Body mass index is 33.78 kg/(m^2). Affect appropriate Healthy:  appears stated  age 64: normal Neck supple with no adenopathy JVP normal no bruits no thyromegaly Lungs clear with no wheezing and good diaphragmatic motion Heart:  S1/S2 no murmur, no rub, gallop or click PMI normal Abdomen: benighn, BS positve, no tenderness, no AAA no bruit.  No HSM or HJR Distal pulses intact with no bruits No edema Neuro non-focal Skin warm and dry No muscular weakness    EKG:  02/22/13 rapid afib rate 163 no pre excitation   12/18/15  SR rate 86 QRS 106 QT 368  Normal    Recent Labs: No results found for requested labs within last 365 days.    Lipid Panel No results found for: CHOL, TRIG, HDL, CHOLHDL, VLDL, LDLCALC, LDLDIRECT    Wt Readings from Last 3 Encounters:  01/28/16 256 lb (116.121 kg)  12/18/15 249 lb 9.6 oz (113.218 kg)      Other studies Reviewed: Additional studies/ records that were reviewed today include: Epic notes ECG Echo 2014 .    ASSESSMENT AND PLAN:  1.  PAF  Infrequent on Toprol no need for regular flecainide continue pill in pocket  2. Narcolepsy continue provigil 3. Anxiety Attacks continue Effexor  4. ED:  PRN viagra    Current medicines are reviewed at length with the patient today.  The patient does not have concerns regarding medicines.  The following changes have been made:  Bystolic 10 mg  Labs/ tests ordered today include: 21 day event monitor   No orders of the defined types were placed in this encounter.     Disposition:   FU with me in     Signed, Jenkins Rouge, MD  01/28/2016 9:13 AM    [address], [place], [place]  [address] Phone: [phone]; Fax: [phone]

## 2016-01-28 ENCOUNTER — Ambulatory Visit (INDEPENDENT_AMBULATORY_CARE_PROVIDER_SITE_OTHER): Payer: BLUE CROSS/BLUE SHIELD | Admitting: Cardiovascular Disease

## 2016-01-28 ENCOUNTER — Encounter: Payer: Self-pay | Admitting: Cardiovascular Disease

## 2016-01-28 VITALS — BP 130/90 | HR 72 | Ht 73.0 in | Wt 256.0 lb

## 2016-01-28 DIAGNOSIS — I480 Paroxysmal atrial fibrillation: Secondary | ICD-10-CM

## 2016-01-28 NOTE — Patient Instructions (Signed)

## 2016-07-04 DIAGNOSIS — I480 Paroxysmal atrial fibrillation: Secondary | ICD-10-CM | POA: Diagnosis not present

## 2016-07-04 DIAGNOSIS — E291 Testicular hypofunction: Secondary | ICD-10-CM | POA: Diagnosis not present

## 2016-07-04 DIAGNOSIS — G4719 Other hypersomnia: Secondary | ICD-10-CM | POA: Diagnosis not present

## 2016-07-04 DIAGNOSIS — Z23 Encounter for immunization: Secondary | ICD-10-CM | POA: Diagnosis not present

## 2016-07-04 DIAGNOSIS — R030 Elevated blood-pressure reading, without diagnosis of hypertension: Secondary | ICD-10-CM | POA: Diagnosis not present

## 2016-07-04 DIAGNOSIS — R7309 Other abnormal glucose: Secondary | ICD-10-CM | POA: Diagnosis not present

## 2016-07-04 DIAGNOSIS — E784 Other hyperlipidemia: Secondary | ICD-10-CM | POA: Diagnosis not present

## 2016-08-22 ENCOUNTER — Encounter: Payer: Self-pay | Admitting: Neurology

## 2016-08-23 ENCOUNTER — Ambulatory Visit (INDEPENDENT_AMBULATORY_CARE_PROVIDER_SITE_OTHER): Payer: BLUE CROSS/BLUE SHIELD | Admitting: Neurology

## 2016-08-23 ENCOUNTER — Telehealth: Payer: Self-pay

## 2016-08-23 ENCOUNTER — Encounter: Payer: Self-pay | Admitting: Neurology

## 2016-08-23 VITALS — BP 144/95 | HR 80 | Resp 20 | Ht 73.0 in | Wt 237.0 lb

## 2016-08-23 DIAGNOSIS — I480 Paroxysmal atrial fibrillation: Secondary | ICD-10-CM

## 2016-08-23 DIAGNOSIS — G4739 Other sleep apnea: Secondary | ICD-10-CM

## 2016-08-23 DIAGNOSIS — G4730 Sleep apnea, unspecified: Secondary | ICD-10-CM

## 2016-08-23 DIAGNOSIS — E6609 Other obesity due to excess calories: Secondary | ICD-10-CM | POA: Diagnosis not present

## 2016-08-23 DIAGNOSIS — G4719 Other hypersomnia: Secondary | ICD-10-CM

## 2016-08-23 DIAGNOSIS — G4731 Primary central sleep apnea: Principal | ICD-10-CM

## 2016-08-23 DIAGNOSIS — G4710 Hypersomnia, unspecified: Secondary | ICD-10-CM | POA: Diagnosis not present

## 2016-08-23 DIAGNOSIS — G4726 Circadian rhythm sleep disorder, shift work type: Secondary | ICD-10-CM

## 2016-08-23 MED ORDER — MODAFINIL 200 MG PO TABS: 200.0000 mg | ORAL_TABLET | Freq: Every day | ORAL | 3 refills | Status: DC

## 2016-08-23 NOTE — Progress Notes (Signed)
SLEEP MEDICINE CLINIC   Provider:  Larey Seat, M D  Referring Provider: Shon Baton, MD Primary Care Physician:  Precious Reel, MD  Chief Complaint  Patient presents with  . 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 male , seen here as a referral/ revisit  from Dr. Virgina Jock for a re -evaluation of EDS. Excessive daytime sleepiness. Last seen 2013/2014 .  Ray Ashley has been diagnosed with mainly central sleep apnea and has used modafinil the generic form of Provigil since 2013 he suffered an atrial fibrillation episode in July 2014 having episodes from there on  once a week or so. He is treated by Dr. Johnsie Cancel.  Alcohol and stress were identified triggers, he had a cardioversion with 300 mg flecainide, he never had chest pain.  Echo showed structurally normal heart with no atrial enlargement with an ejection fraction of 60%. Flecainide was used when necessary at home he was diagnosed with moderate severe central sleep apnea in 2012 2013 but uses his CPAP only infrequently he sleeps in a recliner and the device is not always reachable. He also developed excessive daytime sleepiness when I saw him first in his primary care physician has over the last 6 years provided modafinil for him. Over the last 4 months that were unable to obtain at as the insurance did not cover it anymore. The patient does have the FDA approved indications, such as treated obstructive or central sleep apnea, shift work, and excessive daytime sleepiness persistent. He is here today to have new evaluation please note that the patient is also treated for hypogonadism, anxiety, obesity, elevated liver transaminases, hyperglycemia, atrial fibrillation as described above, but his main concern is hypersomnia.  The patient has been falling asleep at work. Chief complaint according to patient : [employer] dropped my medication , I cannot function  without.   Sleep habits are as follows:  Usually goes to be to read at about  11 PM , 20 minutes and he will fall asleep. When he is sleeping in the bedroom the room is cool, quiet and dark. He goes once a night to the bathroom, and usually before going o sleep, not causing any arousals.He sleeps in a recliner some nights due to back pain. Stays on Effexor and has few dreams, without effexor he has very vivid dreams.  Wakes with dry mouth. Snoring loudly according to wife and snoring was not evident while using CPAP.Rises at 6 AM, needs many alarms to get up in AM, feeling not refreshed.   I'm able today to see a compliance download from his CPAP which is set at only 6 cm water pressure, the same pressure that was prescribed 5 years ago. Over the last 15 days he has used the machine 14 days and all days over 4 hours. As an average user time of 5 hours and 31 minutes. The residual AHI however was 4.7 a little higher than I like it. Residual apneas were 2.2 centrals and 1.1 obstructive. Given that the patient had central dominant apneas it is probably wise to remain on a low pressure setting. An auto titration setting at the risk of triggering more central apneas. However I would like for the patient to continue with good compliance as he has initiated since the beginning of January. He still has a lot of back pain. He needs to lose weight  and he needs to get modafinil again.    Sleep medical history and family sleep history:  Hypersomnia with vivid dreams, CSA on CPAP, but needs to improve compliance. needs Modafinil , no sleep paralysis, no cataplexy.   Social history: patient is married, working in Engineer, production. Tobacco non user, ETOH- less 2 drinks a week. Used to be more until 2014.  Caffeine- 3 coffees a day, mug size, soda - rarely, iced tea 1-2 weekly.  Shift work  Atrial fib .   Review of Systems: Out of a complete 14 system review, the patient complains of only the following  symptoms, and all other reviewed systems are negative. Epworth score 21/24  , Fatigue severity score 60  , depression score 2/15    Social History   Social History  . Marital status: Married    Spouse name: N/A  . Number of children: 2  . Years of education: N/A   Occupational History  . Administrator, Civil Service   Social History Main Topics  . Smoking status: Former Smoker    Types: Cigarettes    Quit date: 02/22/2003  . Smokeless tobacco: Not on file  . Alcohol use Yes     Comment: 1 drink per week socially  . Drug use: No  . Sexual activity: Not on file   Other Topics Concern  . Not on file   Social History Narrative   Patient married with children. Works in Engineer, production. Drinks 3 servings caffeine daily.     Family History  Problem Relation Age of Onset  . Heart attack Maternal Grandfather     deceased from AMI age 76s    Past Medical History:  Diagnosis Date  . Anxiety   . Gilbert's disease    2013  . Hyperlipidemia   . Low testosterone   . Narcolepsy     Past Surgical History:  Procedure Laterality Date  . APPENDECTOMY     childhood  . CERVICAL DISCECTOMY     2009  . KNEE ARTHROSCOPY     1999 and 2004  . WRIST SURGERY     2013    Current Outpatient Prescriptions  Medication Sig Dispense Refill  . flecainide (TAMBOCOR) 100 MG tablet Take 300 mg by mouth as directed.    . meloxicam (MOBIC) 7.5 MG tablet Take 7.5 mg by mouth daily.    . methocarbamol (ROBAXIN) 500 MG tablet Take 500 mg by mouth 4 (four) times daily as needed.    . metoprolol succinate (TOPROL-XL) 25 MG 24 hr tablet Take 1 tablet (25 mg total) by mouth daily. 30 tablet 11  . sildenafil (VIAGRA) 50 MG tablet Take 1 tablet (50 mg total) by mouth daily as needed for erectile dysfunction. 20 tablet 3  . testosterone cypionate (DEPOTESTOTERONE CYPIONATE) 200 MG/ML injection Inject 300 mg into the muscle every 14 (fourteen) days.    Marland Kitchen venlafaxine XR (EFFEXOR-XR) 150 MG  24 hr capsule Take 150 mg by mouth daily.    . modafinil (PROVIGIL) 200 MG tablet Take 200 mg by mouth daily.     No current facility-administered medications for this visit.     Allergies as of 08/23/2016  . (No Known Allergies)    Vitals: BP (!) 144/95   Pulse 80   Resp 20   Ht _0  (1.854 m)   Wt 237 lb (107.5 kg)   BMI 31.27 kg/m  Last Weight:  Wt Readings from Last 1 Encounters:  08/23/16 237 lb (107.5 kg)  RRN:HAFB mass index is 31.27 kg/m.     Last Height:   Ht Readings from Last 1 Encounters:  08/23/16 _0  (1.854 m)    Physical exam:  General: The patient is awake, alert and appears not in acute distress. The patient is well groomed. Head: Normocephalic, atraumatic. Neck is supple. Mallampati 4-5 ,enlarged uvula - the size of a grape !  neck circumference:19. Nasal airflow patent  TMJ is not  evident . Retrognathia is seen. Bruxism Cardiovascular:  Regular rate and rhythm, without  murmurs or carotid bruit, and without distended neck veins. Respiratory: Lungs are clear to auscultation. Skin:  Without evidence of edema, or rash Trunk: BMI is elevated . The patient's posture is erect.  Neurologic exam : The patient is awake and alert, oriented to place and time.   Memory subjective  described as intact.  Attention span & concentration ability appears normal.  Speech is fluent,  without dysarthria, dysphonia or aphasia.  Mood and affect are appropriate.  Cranial nerves: Pupils are equal and briskly reactive to light. Funduscopic exam without evidence of pallor or edema. Extraocular movements  in vertical and horizontal planes intact and without nystagmus. Visual fields by finger perimetry are intact. Hearing to finger rub intact.   Facial sensation intact to fine touch.  Facial motor strength is symmetric and tongue and uvula move midline. Shoulder shrug was symmetrical.   Motor exam:  Normal tone, muscle bulk and symmetric strength in all  extremities.  Sensory:  Fine touch, pinprick and vibration ,Proprioception tested in the upper extremities was normal.  Coordination:  Finger-to-nose maneuver  normal without evidence of ataxia, dysmetria or tremor.  Gait and station: Patient walks without assistive device and is able unassisted to climb up to the exam table. Strength within normal limits.  Stance is stable and normal. Tandem gait is unfragmented. Turns with  3  Steps. Deep tendon reflexes: in the  upper and lower extremities are symmetric and intact. Babinski maneuver response is downgoing.  The patient was advised of the nature of the diagnosed sleep disorder , the treatment options and risks for general a health and wellness arising from not treating the condition.  I spent more than 45 minutes of face to face time with the patient. Greater than 50% of time was spent in counseling and coordination of care. We have discussed the diagnosis and differential and I answered the patient's questions.     Assessment:  After physical and neurologic examination, review of laboratory studies,  Personal review of imaging studies, reports of other /same  Imaging studies ,  Results of polysomnography/ neurophysiology testing and pre-existing records as far as provided in visit., my assessment is   1) Ray Ashley has multiple anatomical risk factors for the presence of sleep apnea, these encompass usually obstructive sleep apnea. 5 years ago he was diagnosed with central dominant apnea and CPAP pressures higher than 6 cm water did create treatment emergent central apnea. While using CPAP at 6 cm water his AHI is 4.7 much better than at baseline but still not optimal. He has just begun using CPAP compliantly again beginning with the holidays of the new year. I would like for him to continue but I would also like to obtain a new baseline. I can't do this either with a home sleep test or with an in lab study. I would like to add that the patient's  excessive daytime sleepiness has been confirmed today was 21 out of 24 possible points  on an Epworth sleepiness score. Consider that he has been mainly compliant since December 24 his sleepiness seems to be unsafe by the use of CPAP. He also is a shift Insurance underwriter. For this reason he should have modafinil refilled at 200 mg daily I will order an HLA narcolepsy test today in addition. Should this test returned positive we will negotiate with [employer]BS to consider other medications treating excessive daytime sleepiness at night for modafinil. I would not prescribe standard stimulant medication given that this patient has a history of paroxysmal atrial fibrillation. Stimulants are contraindicated. Modafinil as the treatment of choice and has been proven to be beneficial for this patient in the past.   Plan:  Treatment plan and additional workup : He cannot wean off effexor , he has vertigo and concentration difficulties. He will undergo HLA narcolepsy testing. I will reorder a sleep study to evaluate the new baseline of apnea  And if the central dominance is still present. The patient needs to comply with CPAP use and needs modafinil.  Rv after sleep study.   Asencion Partridge Vikkie Goeden MD  08/23/2016   CC: Shon Baton, [address] [place], [place] [address]

## 2016-08-23 NOTE — Telephone Encounter (Signed)
HLA narcolepsy lab sent to [place].

## 2016-08-31 NOTE — Telephone Encounter (Signed)
I called pt. We received his HLA results which were positive. Pt has not heard anything about scheduling his sleep study. I advised him that [place] wil lcall him when she hears back from his insurance. Pt verbalized understanding of results. Pt had no questions at this time but was encouraged to call back if questions arise.

## 2016-09-13 ENCOUNTER — Ambulatory Visit (INDEPENDENT_AMBULATORY_CARE_PROVIDER_SITE_OTHER): Payer: BLUE CROSS/BLUE SHIELD | Admitting: Neurology

## 2016-09-13 DIAGNOSIS — G4710 Hypersomnia, unspecified: Secondary | ICD-10-CM | POA: Diagnosis not present

## 2016-09-13 DIAGNOSIS — G4726 Circadian rhythm sleep disorder, shift work type: Secondary | ICD-10-CM

## 2016-09-13 DIAGNOSIS — G4719 Other hypersomnia: Secondary | ICD-10-CM

## 2016-09-13 DIAGNOSIS — G4739 Other sleep apnea: Secondary | ICD-10-CM

## 2016-09-13 DIAGNOSIS — I480 Paroxysmal atrial fibrillation: Secondary | ICD-10-CM

## 2016-09-13 DIAGNOSIS — G4730 Sleep apnea, unspecified: Secondary | ICD-10-CM

## 2016-09-13 DIAGNOSIS — E6609 Other obesity due to excess calories: Secondary | ICD-10-CM

## 2016-09-13 DIAGNOSIS — G4731 Primary central sleep apnea: Principal | ICD-10-CM

## 2016-09-19 ENCOUNTER — Telehealth: Payer: Self-pay | Admitting: Neurology

## 2016-09-19 DIAGNOSIS — G4733 Obstructive sleep apnea (adult) (pediatric): Secondary | ICD-10-CM

## 2016-09-19 DIAGNOSIS — I480 Paroxysmal atrial fibrillation: Secondary | ICD-10-CM

## 2016-09-19 NOTE — Telephone Encounter (Signed)
The arousals were noted as: 30 were spontaneous, 2 were associated with PLMs, 20 were associated with respiratory events.Audio and video analysis did not show any abnormal or unusual movements, behaviors, phonations or vocalizations.  Poor sleep overall prevented SPLIT protocol initiation of CPAP treatment. The patient took no bathroom breaks. Snoring was noted. EKG: in NSR !  IMPRESSION:  1. Severe Obstructive Sleep Apnea (OSA), with an AHI of 48.6,  there were no central apneas noted.  2. Frequent desaturations, SpO2 nadir was 82% with 31 minutes desaturation time.  3. Insignificant PLMs.  4. No atrial fibrillation during PSG. 5. Loud Primary Snoring  RECOMMENDATIONS:  1. Advise full-night, attended, CPAP titration study to optimize therapy.   2. Avoid sedative-hypnotics which may worsen sleep apnea, alcohol and tobacco (as applicable). 3. Advise to lose weight, diet and exercise if not contraindicated (BMI indicates 32). 4. Further information regarding OSA may be obtained from [employer] (www.sleepfoundation.org) or American Sleep Apnea Association (www.sleepapnea.org). 5.  A follow up appointment will be scheduled in the Sleep Clinic at [facility]. The referring provider will be notified of the results.    I certify that I have reviewed the entire raw data recording prior to the issuance of this report in accordance with the Standards of Accreditation of the [place] Academy of Sleep Medicine (AASM)   Larey Seat, MD  09-19-2016

## 2016-09-19 NOTE — Procedures (Signed)
PATIENT'S NAME:  Ray Ashley, Ray Ashley DOB:      [birth year]      MR#:    [id number]     DATE OF RECORDING: 09/13/2016 REFERRING M.D.:  Shon Baton MD Study Performed:   Baseline Polysomnogram HISTORY: Ray Ashley is a 41 y.o. male, seen here for re -evaluation of Excessive daytime sleepiness.   Ray Ashley had been diagnosed with mainly central sleep apnea and has used modafinil since 2013 by PCP, he suffers from paroxysmal atrial fibrillation ( first documented episode in July 2014 He is treated by Dr. Johnsie Cancel. Alcohol and stress were identified triggers, he had a cardioversion with 300 mg flecainide, he never had chest pain. Echo showed structurally normal heart without enlargement and with an ejection fraction of 60%. He was diagnosed with moderate severe central sleep apnea in 2012/ 2013 but uses his CPAP only infrequently. He sleeps in a recliner and the device is not always reachable. He also developed excessive daytime sleepiness when I saw him first in his primary care physician has over the last 6 years provided modafinil for him. Over the last 4 months that were unable to obtain at as the insurance did not cover it anymore. The patient does have the FDA approved indications, such as treated obstructive or central sleep apnea, shift work, and excessive daytime sleepiness /persistent. He is also treated for hypogonadism, anxiety, obesity, elevated liver transaminases, hyperglycemia, atrial fibrillation as described above, but his main concern is hypersomnia.  Central sleep apnea, Anxiety, Gilbert's disease, Hyperlipidemia, Low testosterone, "Narcolepsy".  The patient endorsed the Epworth Sleepiness Scale at 21/24 points.   The patient's weight 237 pounds with a height of 73 (inches), resulting in a BMI of 31.6 kg/m2. The patient's neck circumference measured 19 inches.  CURRENT MEDICATIONS: Tambocor, Mobic, Robaxin, Toprol, Viagra, Depo-testosterone Cyprionate, Effexor, Provigil.   PROCEDURE:   This is a multichannel digital polysomnogram utilizing the Somnostar 11.2 system.  Electrodes and sensors were applied and monitored per AASM Specifications.   EEG, EOG, Chin and Limb EMG, were sampled at 200 Hz.  ECG, Snore and Nasal Pressure, Thermal Airflow, Respiratory Effort, CPAP Flow and Pressure, Oximetry was sampled at 50 Hz. Digital video and audio were recorded.      BASELINE STUDY  Lights Out was at 21:55 and Lights On at 05:04.  Total recording time (TRT) was 430 minutes, with a total sleep time (TST) of 302.5 minutes.   The patient's sleep latency was 132.5 minutes.  REM latency was 194.5 minutes.  The sleep efficiency was 70.3 %.     SLEEP ARCHITECTURE: WASO (Wake after sleep onset) was 7 minutes.  There were 14 minutes in Stage N1, 263 minutes Stage N2, 9.5 minutes Stage N3 and 16 minutes in Stage REM.  The percentage of Stage N1 was 4.6%, Stage N2 was 86.9%, Stage N3 was 3.1% and Stage R (REM sleep) was 5.3%.    RESPIRATORY ANALYSIS:  There were 245 respiratory events:  94 obstructive apneas, 0 central apneas and 0 mixed apneas with a total of 94 apneas and 151 hypopneas with 0 respiratory event related arousals (RERAs).     The total APNEA/HYPOPNEA INDEX (AHI) was 48.6/hr. and the total RESPIRATORY DISTURBANCE INDEX was 48.6 /hr.  13 events occurred in REM sleep and 278 events in NREM. The REM AHI was 48.8 /hr., versus a non-REM AHI of 48.6. The patient spent 0 minutes of total sleep time in the supine position and 303 minutes in non-supine. The supine AHI  was 0.0 versus a non-supine AHI of 48.6/hr..  OXYGEN SATURATION & C02:  The Wake baseline 02 saturation was 95%, with the lowest being 82%. Time spent below 89% saturation equaled 31 minutes.  PERIODIC LIMB MOVEMENTS:   The patient had a total of 23 Periodic Limb Movements.  The Periodic Limb Movement (PLM) index was 4.6 and the PLM Arousal index was 0.4/hour.The arousals were noted as: 30 were spontaneous, 2 were associated with  PLMs, 20 were associated with respiratory events.Audio and video analysis did not show any abnormal or unusual movements, behaviors, phonations or vocalizations.  Poor sleep overall prevented SPLIT protocol initiation of CPAP treatment. The patient took no bathroom breaks. Snoring was noted. EKG: in NSR !  IMPRESSION:  1. Severe Obstructive Sleep Apnea (OSA), with an AHI of 48.6,  there were no central apneas noted.  2. Frequent desaturations, SpO2 nadir was 82% with 31 minutes desaturation time.  3. Insignificant PLMs.  4. No atrial fibrillation over night noted. 5. Loud Primary Snoring  RECOMMENDATIONS:  1. Advise full-night, attended, CPAP titration study to optimize therapy.   2. Avoid sedative-hypnotics which may worsen sleep apnea, alcohol and tobacco (as applicable). 3. Advise to lose weight, diet and exercise if not contraindicated (BMI indicates 32). 4. Further information regarding OSA may be obtained from [employer] (www.sleepfoundation.org) or American Sleep Apnea Association (www.sleepapnea.org). 5.  A follow up appointment will be scheduled in the Sleep Clinic at [facility]. The referring provider will be notified of the results.      I certify that I have reviewed the entire raw data recording prior to the issuance of this report in accordance with the Standards of Accreditation of the American Academy of Sleep Medicine (AASM)      Larey Seat, MD  09-19-2016 Diplomat, American Board of Psychiatry and Neurology  Diplomat, American Board of [place] Director, [facility] [employer]

## 2016-09-20 NOTE — Telephone Encounter (Signed)
-----   Message from Larey Seat, MD sent at 09/19/2016 10:12 AM EST ----- IMPRESSION:  1. Severe Obstructive Sleep Apnea (OSA), with an AHI of 48.6,  there were no central apneas noted.  2. Frequent desaturations, SpO2 nadir was 82% with 31 minutes desaturation time.  3. Insignificant PLMs.  4. No atrial fibrillation during PSG. 5. Loud Primary Snoring  RECOMMENDATIONS:  1. Advise full-night, attended, CPAP titration study to optimize therapy.   2. Avoid sedative-hypnotics which may worsen sleep apnea, alcohol and tobacco (as applicable). 3. Advise to lose weight, diet and exercise if not contraindicated (BMI indicates 32).

## 2016-09-20 NOTE — Telephone Encounter (Signed)
LM for patient to call back for sleep study results.  

## 2016-09-21 NOTE — Telephone Encounter (Signed)
I spoke to patient. He is aware of results and recommendations. Patient states that he is on CPAP currently.  He asks if it is necessary to do the titration? He says that he has a Hospital doctor plan and would prefer not to pay for 2nd sleep study.  How do you advise?

## 2016-09-22 NOTE — Addendum Note (Signed)
Addended by: Larey Seat on: 09/22/2016 04:27 PM   Modules accepted: Orders

## 2016-09-22 NOTE — Telephone Encounter (Signed)
  I can order an auto-titration 5-20 cm water with 3 cm EPR for him. He needs to tell DME which interface he likes the best. CD

## 2016-09-27 NOTE — Telephone Encounter (Signed)
I spoke to patient and let him know that Dr. Brett Fairy was agreeable to order Autopap instead. Patient voiced understanding. Asked to use [facility]. I advised him to call back if he is unable to afford Auto-PAP, and maybe we can see if [facility] will give patient a loaner machine.

## 2016-11-21 ENCOUNTER — Ambulatory Visit: Payer: Self-pay | Admitting: Neurology

## 2016-11-22 ENCOUNTER — Telehealth: Payer: Self-pay

## 2016-11-22 NOTE — Telephone Encounter (Signed)
LM for patient to call back and r/s with Dr. Brett Fairy or NP (due to bad weather this past week)

## 2016-12-28 DIAGNOSIS — Z0000 Encounter for general adult medical examination without abnormal findings: Secondary | ICD-10-CM | POA: Diagnosis not present

## 2016-12-28 DIAGNOSIS — Z125 Encounter for screening for malignant neoplasm of prostate: Secondary | ICD-10-CM | POA: Diagnosis not present

## 2016-12-28 DIAGNOSIS — R7309 Other abnormal glucose: Secondary | ICD-10-CM | POA: Diagnosis not present

## 2017-01-05 DIAGNOSIS — I480 Paroxysmal atrial fibrillation: Secondary | ICD-10-CM | POA: Diagnosis not present

## 2017-01-05 DIAGNOSIS — M79672 Pain in left foot: Secondary | ICD-10-CM | POA: Diagnosis not present

## 2017-01-05 DIAGNOSIS — G4710 Hypersomnia, unspecified: Secondary | ICD-10-CM | POA: Diagnosis not present

## 2017-01-05 DIAGNOSIS — Z0000 Encounter for general adult medical examination without abnormal findings: Secondary | ICD-10-CM | POA: Diagnosis not present

## 2017-01-05 DIAGNOSIS — R030 Elevated blood-pressure reading, without diagnosis of hypertension: Secondary | ICD-10-CM | POA: Diagnosis not present

## 2017-01-05 DIAGNOSIS — Z1389 Encounter for screening for other disorder: Secondary | ICD-10-CM | POA: Diagnosis not present

## 2017-01-07 ENCOUNTER — Other Ambulatory Visit: Payer: Self-pay | Admitting: Cardiovascular Disease

## 2017-01-07 DIAGNOSIS — N5201 Erectile dysfunction due to arterial insufficiency: Secondary | ICD-10-CM

## 2017-01-09 NOTE — Telephone Encounter (Signed)
Pt requesting a refill on sildenafil 50 mg tablet. Would you like to refill this medication/ please advise

## 2017-01-25 ENCOUNTER — Ambulatory Visit (INDEPENDENT_AMBULATORY_CARE_PROVIDER_SITE_OTHER): Payer: BLUE CROSS/BLUE SHIELD

## 2017-01-25 ENCOUNTER — Encounter: Payer: Self-pay | Admitting: Podiatry

## 2017-01-25 ENCOUNTER — Ambulatory Visit (INDEPENDENT_AMBULATORY_CARE_PROVIDER_SITE_OTHER): Payer: BLUE CROSS/BLUE SHIELD | Admitting: Podiatry

## 2017-01-25 DIAGNOSIS — M79672 Pain in left foot: Secondary | ICD-10-CM

## 2017-01-25 DIAGNOSIS — M722 Plantar fascial fibromatosis: Secondary | ICD-10-CM | POA: Diagnosis not present

## 2017-01-25 MED ORDER — METHYLPREDNISOLONE 4 MG PO TBPK: ORAL_TABLET | ORAL | 0 refills | Status: DC

## 2017-01-25 NOTE — Progress Notes (Signed)
He presents today with chief complaint of plain to his left heel. He states that an Akin for about 9 months. He's have morning pain. He takes meloxicam for his back which really hasn't helped that much. He tried over-the-counter insoles to know about.  Objective: Vital signs are stable alert and oriented 3. Pulses are palpable. Neurologic sensorium is intact. Deep tendon reflexes are intact. He has pain on palpation medial calcaneal tubercle of the left heel. Radiographs demonstrate soft tissue increase in density on her fascia insertion site of left heel. No fractures are identified.  Assessment: Plantar fasciitis left.  Plan: I injected the area today with local anesthetic started on a Medrol Dosepak to be followed by meloxicam. Put him in a plantar fascia brace and night splint discussed appropriate shoe gear stretching exercises ice therapy and shoe gear modifications.

## 2017-01-25 NOTE — Patient Instructions (Addendum)
Plantar Fasciitis Rehab Ask your health care provider which exercises are safe for you. Do exercises exactly as told by your health care provider and adjust them as directed. It is normal to feel mild stretching, pulling, tightness, or discomfort as you do these exercises, but you should stop right away if you feel sudden pain or your pain gets worse. Do not begin these exercises until told by your health care provider. Stretching and range of motion exercises These exercises warm up your muscles and joints and improve the movement and flexibility of your foot. These exercises also help to relieve pain. Exercise A: Plantar fascia stretch  1. Sit with your left / right leg crossed over your opposite knee. 2. Hold your heel with one hand with that thumb near your arch. With your other hand, hold your toes and gently pull them back toward the top of your foot. You should feel a stretch on the bottom of your toes or your foot or both. 3. Hold this stretch for__________ seconds. 4. Slowly release your toes and return to the starting position. Repeat __________ times. Complete this exercise __________ times a day. Exercise B: Gastroc, standing  1. Stand with your hands against a wall. 2. Extend your left / right leg behind you, and bend your front knee slightly. 3. Keeping your heels on the floor and keeping your back knee straight, shift your weight toward the wall without arching your back. You should feel a gentle stretch in your left / right calf. 4. Hold this position for __________ seconds. Repeat __________ times. Complete this exercise __________ times a day. Exercise C: Soleus, standing 1. Stand with your hands against a wall. 2. Extend your left / right leg behind you, and bend your front knee slightly. 3. Keeping your heels on the floor, bend your back knee and slightly shift your weight over the back leg. You should feel a gentle stretch deep in your calf. 4. Hold this position for  __________ seconds. Repeat __________ times. Complete this exercise __________ times a day. Exercise D: Gastrocsoleus, standing 1. Stand with the ball of your left / right foot on a step. The ball of your foot is on the walking surface, right under your toes. 2. Keep your other foot firmly on the same step. 3. Hold onto the wall or a railing for balance. 4. Slowly lift your other foot, allowing your body weight to press your heel down over the edge of the step. You should feel a stretch in your left / right calf. 5. Hold this position for __________ seconds. 6. Return both feet to the step. 7. Repeat this exercise with a slight bend in your left / right knee. Repeat __________ times with your left / right knee straight and __________ times with your left / right knee bent. Complete this exercise __________ times a day. Balance exercise This exercise builds your balance and strength control of your arch to help take pressure off your plantar fascia. Exercise E: Single leg stand 1. Without shoes, stand near a railing or in a doorway. You may hold onto the railing or door frame as needed. 2. Stand on your left / right foot. Keep your big toe down on the floor and try to keep your arch lifted. Do not let your foot roll inward. 3. Hold this position for __________ seconds. 4. If this exercise is too easy, you can try it with your eyes closed or while standing on a pillow. Repeat __________ times. Complete  this exercise __________ times a day. This information is not intended to replace advice given to you by your health care provider. Make sure you discuss any questions you have with your health care provider. Document Released: 07/25/2005 Document Revised: 03/29/2016 Document Reviewed: 06/08/2015 Elsevier Interactive Patient Education  2018 [place] A heel spur is a bony growth that forms on the bottom of your heel bone (calcaneus). Heel spurs are common and do not always cause  pain. However, heel spurs often cause inflammation in the strong band of tissue that runs underneath the bone of your foot (plantar fascia). When this happens, you may feel pain on the bottom of your foot, near your heel. What are the causes? The cause of heel spurs is not completely understood. They may be caused by pressure on the heel. Or, they may stem from the muscle attachments (tendons) near the spur pulling on the heel. What increases the risk? You may be at risk for a heel spur if you:  Are older than 40.  Are overweight.  Have wear and tear arthritis (osteoarthritis).  Have plantar fascia inflammation.  What are the signs or symptoms? Some people have heel spurs but no symptoms. If you do have symptoms, they may include:  Pain in the bottom of your heel.  Pain that is worse when you first get out of bed.  Pain that gets worse after walking or standing.  How is this diagnosed? Your health care provider may diagnose a heel spur based on your symptoms and a physical exam. You may also have an X-ray of your foot to check for a bony growth coming from the calcaneus. How is this treated? Treatment aims to relieve the pain from the heel spur. This may include:  Stretching exercises.  Losing weight.  Wearing specific shoes, inserts, or orthotics for comfort and support.  Wearing splints at night to properly position your feet.  Taking over-the-counter medicine to relieve pain.  Being treated with high-intensity sound waves to break up the heel spur (extracorporeal shock wave therapy).  Getting steroid injections in your heel to reduce swelling and ease pain.  Having surgery if your heel spur causes long-term (chronic) pain.  Follow these instructions at home:  Take medicines only as directed by your health care provider.  Ask your health care provider if you should use ice or cold packs on the painful areas of your heel or foot.  Avoid activities that cause you  pain until you recover or as directed by your health care provider.  Stretch before exercising or being physically active.  Wear supportive shoes that fit well as directed by your health care provider. You might need to buy new shoes. Wearing old shoes or shoes that do not fit correctly may not provide the support that you need.  Lose weight if your health care provider thinks you should. This can relieve pressure on your foot that may be causing pain and discomfort. Contact a health care provider if:  Your pain continues or gets worse. This information is not intended to replace advice given to you by your health care provider. Make sure you discuss any questions you have with your health care provider. Document Released: 08/31/2005 Document Revised: 12/31/2015 Document Reviewed: 09/25/2013 Elsevier Interactive Patient Education  [employer].

## 2017-02-09 ENCOUNTER — Other Ambulatory Visit: Payer: Self-pay | Admitting: Cardiovascular Disease

## 2017-03-01 ENCOUNTER — Encounter: Payer: Self-pay | Admitting: Podiatry

## 2017-03-01 ENCOUNTER — Ambulatory Visit (INDEPENDENT_AMBULATORY_CARE_PROVIDER_SITE_OTHER): Payer: BLUE CROSS/BLUE SHIELD | Admitting: Podiatry

## 2017-03-01 DIAGNOSIS — M722 Plantar fascial fibromatosis: Secondary | ICD-10-CM

## 2017-03-01 NOTE — Progress Notes (Signed)
He presents for his four-week follow-up for plantar fasciitis in his left foot. He states that I went from [place] walking on it to 100% better in just a few days. He states is doing much better in the brace is really helped out a lot. He continues his night splints day brace and oral anti-inflammatories and wearing the appropriate shoe gear.  Objective: Vital signs are stable he is alert and oriented 3 pulses are palpable. He still has some residual pain to the plantar medial tubercle of the left heel. It is no longer warm to the touch and indurated.  Assessment: Well-healing plantar fasciitis left.  Plan: Continue all conservative therapies listed above for the next month. Notify us with any regression.

## 2017-03-02 DIAGNOSIS — F411 Generalized anxiety disorder: Secondary | ICD-10-CM | POA: Diagnosis not present

## 2017-03-09 DIAGNOSIS — F411 Generalized anxiety disorder: Secondary | ICD-10-CM | POA: Diagnosis not present

## 2017-03-13 ENCOUNTER — Other Ambulatory Visit: Payer: Self-pay | Admitting: Cardiovascular Disease

## 2017-03-16 ENCOUNTER — Other Ambulatory Visit: Payer: Self-pay | Admitting: Neurology

## 2017-03-16 DIAGNOSIS — F411 Generalized anxiety disorder: Secondary | ICD-10-CM | POA: Diagnosis not present

## 2017-03-30 DIAGNOSIS — F411 Generalized anxiety disorder: Secondary | ICD-10-CM | POA: Diagnosis not present

## 2017-04-18 ENCOUNTER — Other Ambulatory Visit: Payer: Self-pay | Admitting: Cardiovascular Disease

## 2017-04-27 DIAGNOSIS — F411 Generalized anxiety disorder: Secondary | ICD-10-CM | POA: Diagnosis not present

## 2017-05-11 DIAGNOSIS — F411 Generalized anxiety disorder: Secondary | ICD-10-CM | POA: Diagnosis not present

## 2017-06-01 DIAGNOSIS — F411 Generalized anxiety disorder: Secondary | ICD-10-CM | POA: Diagnosis not present

## 2017-06-12 DIAGNOSIS — T464X5A Adverse effect of angiotensin-converting-enzyme inhibitors, initial encounter: Secondary | ICD-10-CM | POA: Diagnosis not present

## 2017-06-12 DIAGNOSIS — R05 Cough: Secondary | ICD-10-CM | POA: Diagnosis not present

## 2017-06-12 DIAGNOSIS — B9789 Other viral agents as the cause of diseases classified elsewhere: Secondary | ICD-10-CM | POA: Diagnosis not present

## 2017-06-12 DIAGNOSIS — J069 Acute upper respiratory infection, unspecified: Secondary | ICD-10-CM | POA: Diagnosis not present

## 2017-06-15 DIAGNOSIS — F411 Generalized anxiety disorder: Secondary | ICD-10-CM | POA: Diagnosis not present

## 2017-07-06 DIAGNOSIS — G4719 Other hypersomnia: Secondary | ICD-10-CM | POA: Diagnosis not present

## 2017-07-06 DIAGNOSIS — R030 Elevated blood-pressure reading, without diagnosis of hypertension: Secondary | ICD-10-CM | POA: Diagnosis not present

## 2017-07-06 DIAGNOSIS — I480 Paroxysmal atrial fibrillation: Secondary | ICD-10-CM | POA: Diagnosis not present

## 2017-07-06 DIAGNOSIS — M79672 Pain in left foot: Secondary | ICD-10-CM | POA: Diagnosis not present

## 2017-07-06 DIAGNOSIS — Z6832 Body mass index (BMI) 32.0-32.9, adult: Secondary | ICD-10-CM | POA: Diagnosis not present

## 2017-07-06 DIAGNOSIS — Z23 Encounter for immunization: Secondary | ICD-10-CM | POA: Diagnosis not present

## 2017-07-20 DIAGNOSIS — F411 Generalized anxiety disorder: Secondary | ICD-10-CM | POA: Diagnosis not present

## 2017-07-21 DIAGNOSIS — J069 Acute upper respiratory infection, unspecified: Secondary | ICD-10-CM | POA: Diagnosis not present

## 2017-08-24 DIAGNOSIS — F411 Generalized anxiety disorder: Secondary | ICD-10-CM | POA: Diagnosis not present

## 2018-01-03 DIAGNOSIS — Z0000 Encounter for general adult medical examination without abnormal findings: Secondary | ICD-10-CM | POA: Diagnosis not present

## 2018-01-03 DIAGNOSIS — R7309 Other abnormal glucose: Secondary | ICD-10-CM | POA: Diagnosis not present

## 2018-01-03 DIAGNOSIS — E7849 Other hyperlipidemia: Secondary | ICD-10-CM | POA: Diagnosis not present

## 2018-01-03 DIAGNOSIS — E291 Testicular hypofunction: Secondary | ICD-10-CM | POA: Diagnosis not present

## 2018-01-03 DIAGNOSIS — Z125 Encounter for screening for malignant neoplasm of prostate: Secondary | ICD-10-CM | POA: Diagnosis not present

## 2018-01-08 DIAGNOSIS — M1990 Unspecified osteoarthritis, unspecified site: Secondary | ICD-10-CM | POA: Diagnosis not present

## 2018-01-08 DIAGNOSIS — G4710 Hypersomnia, unspecified: Secondary | ICD-10-CM | POA: Diagnosis not present

## 2018-01-08 DIAGNOSIS — Z125 Encounter for screening for malignant neoplasm of prostate: Secondary | ICD-10-CM | POA: Diagnosis not present

## 2018-01-08 DIAGNOSIS — Z1389 Encounter for screening for other disorder: Secondary | ICD-10-CM | POA: Diagnosis not present

## 2018-01-08 DIAGNOSIS — R030 Elevated blood-pressure reading, without diagnosis of hypertension: Secondary | ICD-10-CM | POA: Diagnosis not present

## 2018-01-08 DIAGNOSIS — Z0000 Encounter for general adult medical examination without abnormal findings: Secondary | ICD-10-CM | POA: Diagnosis not present

## 2018-01-08 DIAGNOSIS — F418 Other specified anxiety disorders: Secondary | ICD-10-CM | POA: Diagnosis not present

## 2018-02-01 ENCOUNTER — Other Ambulatory Visit: Payer: Self-pay | Admitting: Cardiovascular Disease

## 2018-02-01 DIAGNOSIS — N5201 Erectile dysfunction due to arterial insufficiency: Secondary | ICD-10-CM

## 2018-07-12 DIAGNOSIS — Z7251 High risk heterosexual behavior: Secondary | ICD-10-CM | POA: Diagnosis not present

## 2018-07-12 DIAGNOSIS — R030 Elevated blood-pressure reading, without diagnosis of hypertension: Secondary | ICD-10-CM | POA: Diagnosis not present

## 2018-07-12 DIAGNOSIS — R739 Hyperglycemia, unspecified: Secondary | ICD-10-CM | POA: Diagnosis not present

## 2018-07-12 DIAGNOSIS — Z23 Encounter for immunization: Secondary | ICD-10-CM | POA: Diagnosis not present

## 2018-07-12 DIAGNOSIS — Z1389 Encounter for screening for other disorder: Secondary | ICD-10-CM | POA: Diagnosis not present

## 2018-07-12 DIAGNOSIS — I480 Paroxysmal atrial fibrillation: Secondary | ICD-10-CM | POA: Diagnosis not present

## 2018-07-19 DIAGNOSIS — Z7251 High risk heterosexual behavior: Secondary | ICD-10-CM | POA: Diagnosis not present

## 2019-08-30 DIAGNOSIS — Z0000 Encounter for general adult medical examination without abnormal findings: Secondary | ICD-10-CM | POA: Diagnosis not present

## 2019-08-30 DIAGNOSIS — E291 Testicular hypofunction: Secondary | ICD-10-CM | POA: Diagnosis not present

## 2019-08-30 DIAGNOSIS — E7849 Other hyperlipidemia: Secondary | ICD-10-CM | POA: Diagnosis not present

## 2019-08-30 DIAGNOSIS — Z125 Encounter for screening for malignant neoplasm of prostate: Secondary | ICD-10-CM | POA: Diagnosis not present

## 2019-08-30 DIAGNOSIS — Z7251 High risk heterosexual behavior: Secondary | ICD-10-CM | POA: Diagnosis not present

## 2019-09-03 DIAGNOSIS — Z0000 Encounter for general adult medical examination without abnormal findings: Secondary | ICD-10-CM | POA: Diagnosis not present

## 2019-09-03 DIAGNOSIS — M79672 Pain in left foot: Secondary | ICD-10-CM | POA: Diagnosis not present

## 2019-09-03 DIAGNOSIS — M1990 Unspecified osteoarthritis, unspecified site: Secondary | ICD-10-CM | POA: Diagnosis not present

## 2019-09-03 DIAGNOSIS — Z1331 Encounter for screening for depression: Secondary | ICD-10-CM | POA: Diagnosis not present

## 2019-09-03 DIAGNOSIS — Z7251 High risk heterosexual behavior: Secondary | ICD-10-CM | POA: Diagnosis not present

## 2019-09-03 DIAGNOSIS — R3915 Urgency of urination: Secondary | ICD-10-CM | POA: Diagnosis not present

## 2019-11-01 DIAGNOSIS — Z23 Encounter for immunization: Secondary | ICD-10-CM | POA: Diagnosis not present

## 2020-05-13 ENCOUNTER — Telehealth: Payer: Self-pay | Admitting: Neurology

## 2020-05-13 NOTE — Telephone Encounter (Signed)
Pt called in and had talked with Hilda Blades in medical records. Pt requested for [place] to send a prescription to him for a new sleep machine. Pt was advised that since over 3 yrs he will need a referral to be seen in the sleep clinic. This requires a new script and we are unable to write a script when not seeing the patient and reviewing his download on his old machine. Pt then requested the nurse call him.

## 2020-05-13 NOTE — Telephone Encounter (Signed)
Called the patient to discuss his concern. Patient was under the impression that he would have to have another sleep study to get an order for machine and he didn't want to do that. I advised that I believe there was a misunderstanding, informed that he doesn't necessarily need another sleep study but because its been over 3 yrs since he was seen, we would require a new referral for sleep consult. Advised that his PCP would have to send this information over to our office for [place] to get him in with an apt which will allow [place] to make sure his current CPAP is working well and he has the right setting. Pt verbalized understanding and had no further questions.

## 2020-05-28 DIAGNOSIS — E785 Hyperlipidemia, unspecified: Secondary | ICD-10-CM | POA: Diagnosis not present

## 2020-05-28 DIAGNOSIS — I480 Paroxysmal atrial fibrillation: Secondary | ICD-10-CM | POA: Diagnosis not present

## 2020-05-28 DIAGNOSIS — Z7689 Persons encountering health services in other specified circumstances: Secondary | ICD-10-CM | POA: Diagnosis not present

## 2020-05-28 DIAGNOSIS — Z7251 High risk heterosexual behavior: Secondary | ICD-10-CM | POA: Diagnosis not present

## 2020-06-15 ENCOUNTER — Ambulatory Visit (INDEPENDENT_AMBULATORY_CARE_PROVIDER_SITE_OTHER): Payer: BC Managed Care – PPO | Admitting: Neurology

## 2020-06-15 ENCOUNTER — Encounter: Payer: Self-pay | Admitting: Neurology

## 2020-06-15 ENCOUNTER — Other Ambulatory Visit: Payer: Self-pay | Admitting: Neurology

## 2020-06-15 VITALS — BP 158/102 | HR 92 | Ht 74.0 in | Wt 257.0 lb

## 2020-06-15 DIAGNOSIS — G4719 Other hypersomnia: Secondary | ICD-10-CM

## 2020-06-15 DIAGNOSIS — G4733 Obstructive sleep apnea (adult) (pediatric): Secondary | ICD-10-CM

## 2020-06-15 DIAGNOSIS — G4710 Hypersomnia, unspecified: Secondary | ICD-10-CM | POA: Diagnosis not present

## 2020-06-15 DIAGNOSIS — I480 Paroxysmal atrial fibrillation: Secondary | ICD-10-CM | POA: Diagnosis not present

## 2020-06-15 MED ORDER — ARMODAFINIL 250 MG PO TABS: 250.0000 mg | ORAL_TABLET | Freq: Every day | ORAL | 5 refills | Status: AC

## 2020-06-15 NOTE — Patient Instructions (Signed)
NUVIGIL- Armodafinil tablets What is this medicine? ARMODAFINIL (ar moe DAF i nil) is used to treat excessive sleepiness caused by certain sleep disorders. This includes narcolepsy, sleep apnea, and shift work sleep disorder. This medicine may be used for other purposes; ask your health care provider or pharmacist if you have questions. COMMON BRAND NAME(S): Nuvigil What should I tell my health care provider before I take this medicine? They need to know if you have any of these conditions:  bipolar disorder  depression  drug or alcohol abuse or addiction  heart disease  high blood pressure  kidney disease  liver disease  schizophrenia  suicidal thoughts, plans, or attempt; a previous suicide attempt by you or a family member  an unusual or allergic reaction to armodafinil, modafinil, medicines, foods, dyes, or preservatives  pregnant or trying to get pregnant  breast-feeding How should I use this medicine? Take this medicine by mouth with a glass of water. Follow the directions on the prescription label. Take your doses at regular intervals. Do not take your medicine more often than directed. Do not stop taking this medicine suddenly except upon the advice of your doctor. Stopping this medicine too quickly may cause serious side effects or your condition may worsen. A special MedGuide will be given to you by the pharmacist with each prescription and refill. Be sure to read this information carefully each time. Talk to your pediatrician regarding the use of this medicine in children. While this drug may be prescribed for children as young as 25 years of age for selected conditions, precautions do apply. Overdosage: If you think you have taken too much of this medicine contact a poison control center or emergency room at once. NOTE: This medicine is only for you. Do not share this medicine with others. What if I miss a dose? If you miss a dose, take it as soon as you can. If it  is almost time for your next dose, take only that dose. Do not take double or extra doses. What may interact with this medicine? Do not take this medicine with any of the following medications:  amphetamine or dextroamphetamine  dexmethylphenidate or methylphenidate  MAOIs like Carbex, Eldepryl, Marplan, Nardil, and Parnate  pemoline  procarbazine This medicine may also interact with the following medications:  antifungal medicines like itraconazole or ketoconazole  barbiturates, like phenobarbital  birth control pills or other hormone-containing birth control devices or implants  carbamazepine  cyclosporine  diazepam  medicines for depression, anxiety, or psychotic disturbances  phenytoin  propranolol  triazolam  warfarin This list may not describe all possible interactions. Give your health care provider a list of all the medicines, herbs, non-prescription drugs, or dietary supplements you use. Also tell them if you smoke, drink alcohol, or use illegal drugs. Some items may interact with your medicine. What should I watch for while using this medicine? Visit your doctor or healthcare provider for regular checks on your progress. The full effect of this medicine may not be seen right away. This medicine may affect your concentration, function, or may hide signs that you are tired. You may get dizzy. This medicine will not eliminate your abnormal tendency to fall asleep and is not a replacement for sleep. Do not change your previous behavior regarding potentially dangerous activities, such as driving, using machinery, or doing anything that needs mental alertness until you know how this drug affects you. Alcohol can make you more dizzy and may interfere with your response to this medicine  or your alertness. Avoid alcoholic drinks. This medicine may cause serious skin reactions. They can happen weeks to months after starting the medicine. Contact your healthcare provider right  away if you notice fevers or flu-like symptoms with a rash. The rash may be red or purple and then turn into blisters or peeling of the skin. Or, you might notice a red rash with swelling of the face, lips, or lymph nodes in your neck or under your arms. Birth control pills may not work properly while you are taking this medicine. While using birth control pills, you will need an additional barrier method or an alternative non-hormonal method of birth control during treatment with armodafinil and for 1 month after stopping armodafinil. Talk to your doctor about which extra method of birth control is right for you. It is unknown if the effects of this medicine will be increased by the use of caffeine. Caffeine is found in many foods, beverages, and medications. Ask your doctor if you should limit or change your intake of caffeine-containing products while on this medicine. Do not stop previously prescribed treatments for your condition, such as a CPAP machine, except on the advise of your physician or healthcare provider. What side effects may I notice from receiving this medicine? Side effects that you should report to your doctor or health care professional as soon as possible:  allergic reactions like skin rash, itching or hives, swelling of the face, lips, or tongue  anxiety  breathing or swallowing problems  chest pain  depressed mood  elevated mood, decreased need for sleep, racing thoughts, impulsive behavior  fast, irregular heartbeat  hallucination, loss of contact with reality  increased blood pressure  mouth sores, blisters, or peeling skin  rash, fever, and swollen lymph nodes  redness, blistering, peeling, or loosening of the skin, including inside the mouth  sore throat, fever, or chills  suicidal thoughts or other mood changes  tremors  vomiting Side effects that usually do not require medical attention (report to your doctor or health care professional if they  continue or are bothersome):  headache  nausea, diarrhea, or stomach upset  nervousness  trouble sleeping This list may not describe all possible side effects. Call your doctor for medical advice about side effects. You may report side effects to FDA at 1-800-FDA-1088. Where should I keep my medicine? Keep out of the reach of children. Store at room temperature between 20 and 25 degrees C (68 and 77 degrees F). Throw away any unused medicine after the expiration date. NOTE: This sheet is a summary. It may not cover all possible information. If you have questions about this medicine, talk to your doctor, pharmacist, or health care provider.  2020 Elsevier/Gold Standard (2018-10-16 10:01:22) Hypersomnia Hypersomnia is a condition in which a person feels very tired during the day even though he or she gets plenty of sleep at night. A person with this condition may take naps during the day and may find it very difficult to wake up from sleep. Hypersomnia may affect a person's ability to think, concentrate, drive, or remember things. What are the causes? The cause of this condition may not be known. Possible causes include:  Certain medicines.  Sleep disorders, such as narcolepsy and sleep apnea.  Injury to the head, brain, or spinal cord.  Drug or alcohol use.  Gastroesophageal reflux disease (GERD).  Tumors.  Certain medical conditions, such as depression, diabetes, or an underactive thyroid gland (hypothyroidism). What are the signs or symptoms? The  main symptoms of hypersomnia include:  Feeling very tired throughout the day, regardless of how much sleep you got the night before.  Having trouble waking up. Others may find it difficult to wake you up when you are sleeping.  Sleeping for longer and longer periods at a time.  Taking naps throughout the day. Other symptoms may include:  Feeling restless, anxious, or annoyed.  Lacking energy.  Having trouble  with: ? Remembering. ? Speaking. ? Thinking.  Loss of appetite.  Seeing, hearing, tasting, smelling, or feeling things that are not real (hallucinations). How is this diagnosed? This condition may be diagnosed based on:  Your symptoms and medical history.  Your sleeping habits. Your health care provider may ask you to write down your sleeping habits in a daily sleep log, along with any symptoms you have.  A series of tests that are done while you sleep (sleep study or polysomnogram).  A test that measures how quickly you can fall asleep during the day (daytime nap study or multiple sleep latency test). How is this treated? Treatment can help you manage your condition. Treatment may include:  Following a regular sleep routine.  Lifestyle changes, such as changing your eating habits, getting regular exercise, and avoiding alcohol or caffeinated beverages.  Taking medicines to make you more alert (stimulants) during the day.  Treating any underlying medical causes of hypersomnia. Follow these instructions at home: Sleep routine   Schedule the same bedtime and wake-up time each day.  Practice a relaxing bedtime routine. This may include reading, meditation, deep breathing, or taking a warm bath before going to sleep.  Get regular exercise each day. Avoid strenuous exercise in the evening hours.  Keep your sleep environment at a cooler temperature, darkened, and quiet.  Sleep with pillows and a mattress that are comfortable and supportive.  Schedule short 20-minute naps for when you feel sleepiest during the day.  Talk with your employer or teachers about your hypersomnia. If possible, adjust your schedule so that: ? You have a regular daytime work schedule. ? You can take a scheduled nap during the day. ? You do not have to work or be active at night.  Do not eat a heavy meal for a few hours before bedtime. Eat your meals at about the same times every day.  Avoid  drinking alcohol or caffeinated beverages. Safety   Do not drive or use heavy machinery if you are sleepy. Ask your health care provider if it is safe for you to drive.  Wear a life jacket when swimming or spending time near water. General instructions  Take supplements and over-the-counter and prescription medicines only as told by your health care provider.  Keep a sleep log that will help your doctor manage your condition. This may include information about: ? What time you go to bed each night. ? How often you wake up at night. ? How many hours you sleep at night. ? How often and for how long you nap during the day. ? Any observations from others, such as leg movements during sleep, sleep walking, or snoring.  Keep all follow-up visits as told by your health care provider. This is important. Contact a health care provider if:  You have new symptoms.  Your symptoms get worse. Get help right away if:  You have serious thoughts about hurting yourself or someone else. If you ever feel like you may hurt yourself or others, or have thoughts about taking your own life, get help  right away. You can go to your nearest emergency department or call:  Your local emergency services (911 in the [place]  A suicide crisis helpline, such as the [facility] at 201 593 3115. This is open 24 hours a day. Summary  Hypersomnia refers to a condition in which you feel very tired during the day even though you get plenty of sleep at night.  A person with this condition may take naps during the day and may find it very difficult to wake up from sleep.  Hypersomnia may affect a person's ability to think, concentrate, drive, or remember things.  Treatment, such as following a regular sleep routine and making some lifestyle changes, can help you manage your condition. This information is not intended to replace advice given to you by your health care provider. Make sure you  discuss any questions you have with your health care provider. Document Revised: 07/27/2017 Document Reviewed: 07/27/2017 Elsevier Patient Education  2020 [employer].

## 2020-06-15 NOTE — Progress Notes (Signed)
SLEEP MEDICINE CLINIC    Provider:  Larey Seat, MD  Primary Care Physician:  Shon Baton, MD [place] [address]     Referring Provider: Shon Baton, [place],  [place] [address]          Chief Complaint according to patient   Patient presents with:    . New Patient (Initial Visit)     pt alone, rm 65. presents today, previously seen as a patient here in 2018 where c/o sleep studies. he was set up on a CPAP at a pressure of 6 cm water pressure. there have been no adjustments to the machine.(will bring machine back by to have downloaded) He is wanting a new machine to bill through insurance and establish with local DME. he also would like a script for a travel CPAP. pt states there has been no changes to his health and doesn't want to repeat a study at this time. DME [facility]       HISTORY OF PRESENT ILLNESS:  Ray Ashley is a 44 y.o.  Caucasian male patient and seen here upon a new referral on 06/15/2020.  Chief concern according to patient :  see above -   "Ray Ashley had been diagnosed with mainly central sleep apnea and has used modafinil since 2013 by PCP, he suffers from paroxysmal atrial fibrillation ( first documented episode in July 2014 He is treated by Dr. Johnsie Cancel. Alcohol and stress were identified triggers, he had a cardioversion with 300 mg flecainide, he never had chest pain.Echo showed structurally normal heart without enlargement and with an ejection fraction of 60%. He was diagnosed with moderate severe central sleep apnea in 2012/ 2013 but uses his CPAP only infrequently. He sleeps in a recliner and the device is not always reachable. He also developed excessive daytime sleepiness when I saw him first in his primary care physician has over the last 6 years provided modafinil for him. Over the last 4 months that were unable to obtain at as the insurance did not cover it anymore. The patient does have the FDA approved  indications, such as treated obstructive or central sleep apnea, shift work, and excessive daytime sleepiness /persistent. He is also treated for hypogonadism, anxiety, obesity, elevated liver transaminases, hyperglycemia, atrial fibrillation as described above, but his main concern is hypersomnia. "  06-15-2020;  I have the pleasure of seeing Ray Ashley on 06-15-2020, a right -handed White or Caucasian male with a known sleep disorder. He is here for a new consult, to get a new CPAP machine.  He has a past medical history of Anxiety, Gilbert's disease, Hyperlipidemia, Low testosterone, OSA on CPAP  and excessive daytime sleepiness.  The patient had the first sleep study in the year 2009  with a result of an AHI ( Apnea Hypopnea index)  of 23.8/ RDi was 34.5/h. non REM dependent apnea. 120 central apneas-sleep study repeated 3 years ago showed that he had now obstructive sleep apnea.The total APNEA/HYPOPNEA INDEX (AHI) was 48.6/hr.on 09-13-2016.  Sleep relevant medical history: The patient reports persistent hypersomnia in spite of being a compliant CPAP user.  He also had often interrupted and fragmented sleep at night.  He had used modafinil provided by PCP in the past, but he feels that this has worn off.  Nuvigil was not covered by his insurance also I do not know if it was ever tried in a generic form.  Adderall would usually not be  prescribed for patient has a history of atrial fibrillation or tachycardia.  Current blood pressure today was 158/102.  I may have to go with Sunosi or Wakix as alternatives to modafinil.   Family medical /sleep history: NO other family member on CPAP with OSA.    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 He often works nights and late hours- flexible schedule.  Tobacco use; started back smoking.  ETOH use ; 3-4/ week, Caffeine intake in form of Coffee( 3 a day) and Tea ( in PM ) or energy drinks.   Sleep habits  are as follows: The patient's dinner time is variable as he works some nights.  The preferred sleep position is non-supine  with the support of 1-2 pillows. Dreams are reportedly rare/- averages 7 hours of sleep on CPAP.  He  reports not feeling refreshed or restored in AM, with symptoms such as dry mouth , morning headaches , and residual fatigue.  Naps are taken very frequently, lasting from 15-30  minutes and are more refreshing than nocturnal sleep.    Review of Systems: Out of a complete 14 system review, the patient complains of only the following symptoms, and all other reviewed systems are negative.:  Fatigue, sleepiness , on CPAP - persistent hypersomnia.   How likely are you to doze in the following situations: 0 = not likely, 1 = slight chance, 2 = moderate chance, 3 = high chance   Sitting and Reading? Watching Television? Sitting inactive in a public place (theater or meeting)? As a passenger in a car for an hour without a break? Lying down in the afternoon when circumstances permit? Sitting and talking to someone? Sitting quietly after lunch without alcohol? In a car, while stopped for a few minutes in traffic?   Severe fatigue and Epworth Total =20/ 24 points   FSS endorsed at 62/ 63 points.   Social History   Socioeconomic History  . Marital status: Married    Spouse name: Not on file  . Number of children: 2  . Years of education: Not on file  . Highest education level: Not on file  Occupational History  . 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 Smoking status: Former Smoker    Types: Cigarettes    Quit date: 02/22/2003    Years since quitting: 17.3  . Smokeless tobacco: Never Used  Substance and Sexual Activity  . Alcohol use: Yes    Comment: 1 drink per week socially  . Drug use: No  . Sexual activity: Not on file  Other Topics Concern  . Not on file  Social History Narrative   Patient married with children. Works in  Engineer, production. Drinks 3 servings caffeine daily.    Social Determinants of Health   Financial Resource Strain:   . Difficulty of Paying Living Expenses: Not on file  Food Insecurity:   . Worried About Charity fundraiser in the Last Year: Not on file  . Ran Out of Food in the Last Year: Not on file  Transportation Needs:   . Lack of Transportation (Medical): Not on file  . Lack of Transportation (Non-Medical): Not on file  Physical Activity:   . Days of Exercise per Week: Not on file  . Minutes of Exercise per Session: Not on file  Stress:   . Feeling of Stress : Not on file  Social Connections:   . Frequency  of Communication with Friends and Family: Not on file  . Frequency of Social Gatherings with Friends and Family: Not on file  . Attends Religious Services: Not on file  . Active Member of Clubs or Organizations: Not on file  . Attends Archivist Meetings: Not on file  . Marital Status: Not on file    Family History  Problem Relation Age of Onset  . Heart attack Maternal Grandfather        deceased from AMI age 29s    Past Medical History:  Diagnosis Date  . Anxiety   . Gilbert's disease    2013  . Hyperlipidemia   . Low testosterone   . Narcolepsy     Past Surgical History:  Procedure Laterality Date  . APPENDECTOMY     childhood  . CERVICAL DISCECTOMY     2009  . KNEE ARTHROSCOPY     1999 and 2004  . WRIST SURGERY     2013     Current Outpatient Medications on File Prior to Visit  Medication Sig Dispense Refill  . atorvastatin (LIPITOR) 20 MG tablet Take 20 mg by mouth daily.  3  . flecainide (TAMBOCOR) 100 MG tablet Take 300 mg by mouth as directed.    . meloxicam (MOBIC) 7.5 MG tablet Take 7.5 mg by mouth daily.    . methocarbamol (ROBAXIN) 500 MG tablet Take 500 mg by mouth 4 (four) times daily as needed.    . metoprolol succinate (TOPROL-XL) 25 MG 24 hr tablet TAKE 1 TABLET BY MOUTH DAILY **PLEASE CALL OFFICE FOR APPT** 15  tablet 0  . modafinil (PROVIGIL) 200 MG tablet TAKE ONE TABLET BY MOUTH ONE TIME DAILY  90 tablet 2  . sildenafil (VIAGRA) 50 MG tablet TAKE 1 TABLET BY MOUTH EVERY DAY AS NEEDED FOR ERECTILE DYSFUNCTION 20 tablet 2  . testosterone cypionate (DEPOTESTOTERONE CYPIONATE) 200 MG/ML injection Inject 300 mg into the muscle every 14 (fourteen) days.    . TRANSDERM-SCOP, 1.5 MG, 1 MG/3DAYS     . venlafaxine XR (EFFEXOR-XR) 150 MG 24 hr capsule Take 150 mg by mouth daily.     No current facility-administered medications on file prior to visit.    No Known Allergies  Physical exam:  Today's Vitals   06/15/20 0954  BP: (!) 158/102  Pulse: 92  Weight: 257 lb (116.6 kg)  Height: 6' 2"  (1.88 m)   Body mass index is 33 kg/m.   Wt Readings from Last 3 Encounters:  06/15/20 257 lb (116.6 kg)  08/23/16 237 lb (107.5 kg)  01/28/16 256 lb (116.1 kg)     Ht Readings from Last 3 Encounters:  06/15/20 6' 2"  (1.88 m)  08/23/16 6' 1"  (1.854 m)  01/28/16 6' 1"  (1.854 m)      General: The patient is awake, alert and appears not in acute distress. The patient is well groomed. Head: Normocephalic, atraumatic. Neck is supple. Mallampati 3 plus neck circumference: 19.25  inches .  Nasal airflow patent.  Retrognathia is  seen. Facial hair.  Dental status: intact -  Cardiovascular:  Regular rate and cardiac rhythm by pulse,  without distended neck veins. Respiratory: Lungs are clear to auscultation.  Skin:  Without evidence of ankle edema, or rash. Trunk: The patient's posture is erect. BMI now 33- up from last visit.    Neurologic exam : The patient is awake and alert, oriented to place and time.   Memory subjective described as intact.  Attention span & concentration  ability appears normal.  Speech is fluent,  without  dysarthria, dysphonia or aphasia.  Mood and affect are appropriate.   Cranial nerves: no loss of smell or taste reported  Pupils are equal and briskly reactive to light.  Funduscopic exam deferred. Extraocular movements in vertical and horizontal planes were intact and without nystagmus. No Diplopia. Visual fields by finger perimetry are intact. Hearing was intact.    Facial sensation intact to fine touch.  Facial motor strength is symmetric and tongue and uvula move midline.  Neck ROM : rotation, tilt and flexion extension were normal for age and shoulder shrug was symmetrical.    Motor exam:  Symmetric bulk, tone and ROM.   Normal tone without cog wheeling, symmetric grip strength .   Sensory:   deferred.   Coordination:  The Finger-to-nose maneuver was intact without evidence of ataxia, dysmetria or tremor.   Deep tendon reflexes: in the  upper and lower extremities are symmetric and intact.      OSA: no data download- He is using nasal mask, N 10  because of his facial hair, the machine is 45 years old and needs to be replaced. Last sleep study is close to 45 years old, but except for BMI no other a changes. I will order an autotitration device.  He has atrial fib, BMI 33, caveat.   Hypersomnia - persistent - The irresistible urge to sleep every day many times a day_ While on CPAP. Modafinil has become less and less effective, change to [place] or Sunosi.  He now lives with room mates- could use Xyrem / Donney Rankins.    After spending a total time of 35 minutes face to face and additional time for physical and neurologic examination, review of laboratory studies,  personal review of imaging studies, reports and results of other testing and review of referral information / records as far as provided in visit, I have established the following assessments:     My Plan is to proceed with:  1) HLA narcolepsy test 2) ResMed 9 machine -  Replacement order for auto-CPAP with a setting from 6-16 cm water, and 2 cm water EPR. 3) trail of Sunosi, replacement of modafinil.   I would like to thank Shon Baton, MD and Shon Baton, [place],    [id number] for allowing me to meet with and to take care of this pleasant patient.   In short, Ray Ashley is presenting with persistent excessive daytime sleepiness in the setting of CPAP use for OSA.    I plan to follow up either personally or through our NP within 3-4  month.    Electronically signed by: Larey Seat, MD 06/15/2020 10:27 AM  [facility] Board certified by The [employer] of Sleep Medicine and Diplomate of the [employer] of Sleep Medicine. Board certified In Neurology through the [place], Fellow of the [employer] of Neurology. Medical Director of [facility].

## 2020-06-27 LAB — NARCOLEPSY EVALUATION
DQA1*01:02: POSITIVE
DQB1*06:02: POSITIVE

## 2020-06-27 MED ORDER — VENLAFAXINE HCL ER 75 MG PO CP24: ORAL_CAPSULE | ORAL | 0 refills | Status: AC

## 2020-06-27 NOTE — Addendum Note (Signed)
Addended by: Larey Seat on: 06/27/2020 04:59 PM   Modules accepted: Orders

## 2020-06-27 NOTE — Progress Notes (Signed)
Positive for 2 factors associated with narcolepsy. / warrants further work up in order to be able to the most effective therapy.  I will order PSG and MSLT, I reviewed his meds, etc . The patient needs to be off Armodafinil for 14 days and Effexor ( totally off) for 3 weeks before PSG mit MSLT. We can start weaning him off Effexor now, offering the 75 mg dose for one week, followed by 75 mg every other day for another week, then off.

## 2020-06-29 ENCOUNTER — Institutional Professional Consult (permissible substitution): Payer: BC Managed Care – PPO | Admitting: Neurology

## 2020-06-30 ENCOUNTER — Telehealth: Payer: Self-pay | Admitting: Neurology

## 2020-06-30 ENCOUNTER — Encounter: Payer: Self-pay | Admitting: Neurology

## 2020-06-30 NOTE — Telephone Encounter (Signed)
Called the pt to review the lab findings. There was no answer. Unable to lvm due to mailbox full. Will try mychart message.

## 2020-06-30 NOTE — Telephone Encounter (Signed)
-----   Message from Larey Seat, MD sent at 06/27/2020  4:59 PM EST ----- Positive for 2 factors associated with narcolepsy. / warrants further work up in order to be able to the most effective therapy.  I will order PSG and MSLT, I reviewed his meds, etc . The patient needs to be off Armodafinil for 14 days and Effexor ( totally off) for 3 weeks before PSG mit MSLT. We can start weaning him off Effexor now, offering the 75 mg dose for one week, followed by 75 mg every other day for another week, then off.

## 2020-07-09 NOTE — Telephone Encounter (Signed)
Called the patient and there was no answer. This time was able to leave a VM advising the pt to call back or check my chart message

## 2020-07-27 ENCOUNTER — Telehealth: Payer: Self-pay

## 2020-07-27 DIAGNOSIS — L259 Unspecified contact dermatitis, unspecified cause: Secondary | ICD-10-CM | POA: Diagnosis not present

## 2020-07-27 NOTE — Telephone Encounter (Signed)
LVM for pt to call me back to schedule sleep study  

## 2020-08-05 DIAGNOSIS — L259 Unspecified contact dermatitis, unspecified cause: Secondary | ICD-10-CM | POA: Diagnosis not present

## 2020-08-13 ENCOUNTER — Telehealth: Payer: Self-pay

## 2020-08-13 NOTE — Telephone Encounter (Signed)
LVM for pt to call me back to schedule sleep study  

## 2020-08-25 ENCOUNTER — Telehealth: Payer: Self-pay

## 2020-08-25 DIAGNOSIS — Z20822 Contact with and (suspected) exposure to covid-19: Secondary | ICD-10-CM | POA: Diagnosis not present

## 2020-08-25 NOTE — Telephone Encounter (Signed)
We have attempted to call the patient two times to schedule sleep study.  Patient has been unavailable at the phone numbers we have on file and has not returned our calls. If patient calls back we will schedule them for their sleep study.  

## 2020-10-26 ENCOUNTER — Ambulatory Visit: Payer: BC Managed Care – PPO | Admitting: Adult Health

## 2020-10-26 ENCOUNTER — Encounter: Payer: Self-pay | Admitting: Adult Health

## 2020-12-02 DIAGNOSIS — Z125 Encounter for screening for malignant neoplasm of prostate: Secondary | ICD-10-CM | POA: Diagnosis not present

## 2020-12-02 DIAGNOSIS — E291 Testicular hypofunction: Secondary | ICD-10-CM | POA: Diagnosis not present

## 2020-12-02 DIAGNOSIS — R739 Hyperglycemia, unspecified: Secondary | ICD-10-CM | POA: Diagnosis not present

## 2020-12-02 DIAGNOSIS — E785 Hyperlipidemia, unspecified: Secondary | ICD-10-CM | POA: Diagnosis not present

## 2020-12-08 DIAGNOSIS — Z1339 Encounter for screening examination for other mental health and behavioral disorders: Secondary | ICD-10-CM | POA: Diagnosis not present

## 2020-12-08 DIAGNOSIS — R82998 Other abnormal findings in urine: Secondary | ICD-10-CM | POA: Diagnosis not present

## 2020-12-08 DIAGNOSIS — Z1331 Encounter for screening for depression: Secondary | ICD-10-CM | POA: Diagnosis not present

## 2020-12-08 DIAGNOSIS — Z0000 Encounter for general adult medical examination without abnormal findings: Secondary | ICD-10-CM | POA: Diagnosis not present

## 2020-12-08 DIAGNOSIS — I480 Paroxysmal atrial fibrillation: Secondary | ICD-10-CM | POA: Diagnosis not present

## 2020-12-11 DIAGNOSIS — Z1212 Encounter for screening for malignant neoplasm of rectum: Secondary | ICD-10-CM | POA: Diagnosis not present

## 2021-08-17 DIAGNOSIS — I480 Paroxysmal atrial fibrillation: Secondary | ICD-10-CM | POA: Diagnosis not present

## 2021-08-17 DIAGNOSIS — R030 Elevated blood-pressure reading, without diagnosis of hypertension: Secondary | ICD-10-CM | POA: Diagnosis not present

## 2021-12-02 DIAGNOSIS — E291 Testicular hypofunction: Secondary | ICD-10-CM | POA: Diagnosis not present

## 2021-12-02 DIAGNOSIS — R739 Hyperglycemia, unspecified: Secondary | ICD-10-CM | POA: Diagnosis not present

## 2021-12-02 DIAGNOSIS — E785 Hyperlipidemia, unspecified: Secondary | ICD-10-CM | POA: Diagnosis not present

## 2021-12-02 DIAGNOSIS — Z125 Encounter for screening for malignant neoplasm of prostate: Secondary | ICD-10-CM | POA: Diagnosis not present

## 2021-12-10 DIAGNOSIS — Z1212 Encounter for screening for malignant neoplasm of rectum: Secondary | ICD-10-CM | POA: Diagnosis not present

## 2021-12-10 DIAGNOSIS — Z1331 Encounter for screening for depression: Secondary | ICD-10-CM | POA: Diagnosis not present

## 2021-12-10 DIAGNOSIS — Z23 Encounter for immunization: Secondary | ICD-10-CM | POA: Diagnosis not present

## 2021-12-10 DIAGNOSIS — I480 Paroxysmal atrial fibrillation: Secondary | ICD-10-CM | POA: Diagnosis not present

## 2021-12-10 DIAGNOSIS — R82998 Other abnormal findings in urine: Secondary | ICD-10-CM | POA: Diagnosis not present

## 2021-12-10 DIAGNOSIS — Z0000 Encounter for general adult medical examination without abnormal findings: Secondary | ICD-10-CM | POA: Diagnosis not present

## 2021-12-10 DIAGNOSIS — Z1389 Encounter for screening for other disorder: Secondary | ICD-10-CM | POA: Diagnosis not present

## 2022-04-01 DIAGNOSIS — Z6831 Body mass index (BMI) 31.0-31.9, adult: Secondary | ICD-10-CM | POA: Diagnosis not present

## 2022-04-01 DIAGNOSIS — Z20822 Contact with and (suspected) exposure to covid-19: Secondary | ICD-10-CM | POA: Diagnosis not present

## 2022-04-01 DIAGNOSIS — U071 COVID-19: Secondary | ICD-10-CM | POA: Diagnosis not present

## 2022-06-24 DIAGNOSIS — R739 Hyperglycemia, unspecified: Secondary | ICD-10-CM | POA: Diagnosis not present

## 2022-06-24 DIAGNOSIS — Z7251 High risk heterosexual behavior: Secondary | ICD-10-CM | POA: Diagnosis not present

## 2022-06-24 DIAGNOSIS — E785 Hyperlipidemia, unspecified: Secondary | ICD-10-CM | POA: Diagnosis not present

## 2022-06-24 DIAGNOSIS — E291 Testicular hypofunction: Secondary | ICD-10-CM | POA: Diagnosis not present

## 2022-06-24 DIAGNOSIS — Z23 Encounter for immunization: Secondary | ICD-10-CM | POA: Diagnosis not present

## 2022-07-12 DIAGNOSIS — H25013 Cortical age-related cataract, bilateral: Secondary | ICD-10-CM | POA: Diagnosis not present

## 2022-07-21 DIAGNOSIS — J0100 Acute maxillary sinusitis, unspecified: Secondary | ICD-10-CM | POA: Diagnosis not present

## 2022-07-21 DIAGNOSIS — R0981 Nasal congestion: Secondary | ICD-10-CM | POA: Diagnosis not present

## 2022-07-27 DIAGNOSIS — H25013 Cortical age-related cataract, bilateral: Secondary | ICD-10-CM | POA: Diagnosis not present

## 2022-10-03 DIAGNOSIS — M25551 Pain in right hip: Secondary | ICD-10-CM | POA: Diagnosis not present

## 2022-10-17 DIAGNOSIS — M25551 Pain in right hip: Secondary | ICD-10-CM | POA: Diagnosis not present

## 2022-10-17 DIAGNOSIS — M5416 Radiculopathy, lumbar region: Secondary | ICD-10-CM | POA: Diagnosis not present

## 2022-11-02 DIAGNOSIS — M5416 Radiculopathy, lumbar region: Secondary | ICD-10-CM | POA: Diagnosis not present

## 2022-11-21 DIAGNOSIS — H25013 Cortical age-related cataract, bilateral: Secondary | ICD-10-CM | POA: Diagnosis not present

## 2022-12-30 DIAGNOSIS — Z1212 Encounter for screening for malignant neoplasm of rectum: Secondary | ICD-10-CM | POA: Diagnosis not present

## 2022-12-30 DIAGNOSIS — E291 Testicular hypofunction: Secondary | ICD-10-CM | POA: Diagnosis not present

## 2022-12-30 DIAGNOSIS — E669 Obesity, unspecified: Secondary | ICD-10-CM | POA: Diagnosis not present

## 2022-12-30 DIAGNOSIS — I480 Paroxysmal atrial fibrillation: Secondary | ICD-10-CM | POA: Diagnosis not present

## 2022-12-30 DIAGNOSIS — R82998 Other abnormal findings in urine: Secondary | ICD-10-CM | POA: Diagnosis not present

## 2022-12-30 DIAGNOSIS — Z0000 Encounter for general adult medical examination without abnormal findings: Secondary | ICD-10-CM | POA: Diagnosis not present

## 2022-12-30 DIAGNOSIS — R030 Elevated blood-pressure reading, without diagnosis of hypertension: Secondary | ICD-10-CM | POA: Diagnosis not present

## 2022-12-30 DIAGNOSIS — Z1331 Encounter for screening for depression: Secondary | ICD-10-CM | POA: Diagnosis not present

## 2022-12-30 DIAGNOSIS — R739 Hyperglycemia, unspecified: Secondary | ICD-10-CM | POA: Diagnosis not present
